# Patient Record
Sex: Male | Born: 1970 | Hispanic: Yes | Marital: Married | Smoking: Never smoker
Health system: Southern US, Community
[De-identification: ages and names within clinical notes are randomized; demographics above are authoritative.]

## PROBLEM LIST (undated history)

## (undated) DIAGNOSIS — E079 Disorder of thyroid, unspecified: Secondary | ICD-10-CM

## (undated) DIAGNOSIS — E039 Hypothyroidism, unspecified: Secondary | ICD-10-CM

## (undated) HISTORY — PX: INGUINAL HERNIA REPAIR: SUR1180

---

## 2016-12-17 ENCOUNTER — Other Ambulatory Visit: Payer: Self-pay | Admitting: Occupational Medicine

## 2016-12-17 ENCOUNTER — Ambulatory Visit: Payer: Self-pay

## 2016-12-17 DIAGNOSIS — M25562 Pain in left knee: Secondary | ICD-10-CM

## 2016-12-27 ENCOUNTER — Ambulatory Visit (INDEPENDENT_AMBULATORY_CARE_PROVIDER_SITE_OTHER): Payer: Worker's Compensation | Admitting: Orthopaedic Surgery

## 2016-12-27 ENCOUNTER — Encounter (INDEPENDENT_AMBULATORY_CARE_PROVIDER_SITE_OTHER): Payer: Self-pay | Admitting: Orthopaedic Surgery

## 2016-12-27 DIAGNOSIS — M25562 Pain in left knee: Secondary | ICD-10-CM | POA: Diagnosis not present

## 2016-12-27 NOTE — Progress Notes (Signed)
   Office Visit Note   Patient: David Palmer           Date of Birth: 08/06/1971           MRN: [id number] Visit Date: 12/27/2016              Requested by: No referring provider defined for this encounter. PCP: No primary care provider on file.   Assessment & Plan: Visit Diagnoses: No diagnosis found.  Plan: I will like to put him on light duty for 3 weeks. Continue warm compresses to help the hematoma. Follow-up in 3 weeks for recheck. The language barrier did increase the complexity of the visit. Total face to face encounter time was greater than 45 minutes and over half of this time was spent in counseling and/or coordination of care.  Follow-Up Instructions: Return in about 3 weeks (around 01/17/2017).   Orders:  No orders of the defined types were placed in this encounter.  No orders of the defined types were placed in this encounter.     Procedures: No procedures performed   Clinical Data: No additional findings.   Subjective: No chief complaint on file.   Patient is a Hispanic 46 year old gentleman who stepped into a deep hole about 2 weeks ago while at work. He states that he has swelling on the medial aspect of the knee and lateral aspect of the thigh. The pain is worse with bending of the knee and with weightbearing and feels cold at times. He denies any drainage. He does have some swelling on the lateral aspect of the thigh.    Review of Systems Complete review of systems negative except for history of present illness  Objective: Vital Signs: There were no vitals taken for this visit.  Physical Exam  Constitutional: He is oriented to person, place, and time. He appears well-developed and well-nourished.  HENT:  Head: Normocephalic and atraumatic.  Eyes: Pupils are equal, round, and reactive to light.  Neck: Neck supple.  Pulmonary/Chest: Effort normal.  Abdominal: Soft.  Musculoskeletal: Normal range of motion.  Neurological: He is alert and  oriented to person, place, and time.  Skin: Skin is warm.  Psychiatric: He has a normal mood and affect. His behavior is normal. Judgment and thought content normal.  Nursing note and vitals reviewed.   Ortho Exam Exam of the left knee and thigh shows focal swelling of the proximal medial tibia region. There is no signs of infection or drainage. There is tenderness palpation directly over the swelling. The hamstring tendons are intact. Exam of the left thigh shows a focal area consistent with a hematoma. There is no drainage. The skin is intact. No signs of infection. Specialty Comments:  No specialty comments available.  Imaging: No results found.   PMFS History: There are no active problems to display for this patient.  No past medical history on file.  No family history on file.  No past surgical history on file. Social History   Occupational History  . Not on file.   Social History Main Topics  . Smoking status: Not on file  . Smokeless tobacco: Not on file  . Alcohol use Not on file  . Drug use: Unknown  . Sexual activity: Not on file

## 2016-12-31 ENCOUNTER — Emergency Department (HOSPITAL_COMMUNITY): Payer: Worker's Compensation

## 2016-12-31 ENCOUNTER — Inpatient Hospital Stay (HOSPITAL_COMMUNITY)
Admission: EM | Admit: 2016-12-31 | Discharge: 2017-01-05 | DRG: 581 | Disposition: A | Discharge status: Home / Self Care | Payer: Worker's Compensation | Attending: Internal Medicine | Admitting: Internal Medicine

## 2016-12-31 ENCOUNTER — Telehealth (INDEPENDENT_AMBULATORY_CARE_PROVIDER_SITE_OTHER): Payer: Self-pay | Admitting: *Deleted

## 2016-12-31 ENCOUNTER — Encounter (HOSPITAL_COMMUNITY): Payer: Self-pay | Admitting: Emergency Medicine

## 2016-12-31 DIAGNOSIS — L02416 Cutaneous abscess of left lower limb: Secondary | ICD-10-CM | POA: Diagnosis present

## 2016-12-31 DIAGNOSIS — Z833 Family history of diabetes mellitus: Secondary | ICD-10-CM | POA: Diagnosis not present

## 2016-12-31 DIAGNOSIS — Z881 Allergy status to other antibiotic agents status: Secondary | ICD-10-CM

## 2016-12-31 DIAGNOSIS — E039 Hypothyroidism, unspecified: Secondary | ICD-10-CM | POA: Diagnosis present

## 2016-12-31 DIAGNOSIS — L03116 Cellulitis of left lower limb: Secondary | ICD-10-CM | POA: Diagnosis present

## 2016-12-31 DIAGNOSIS — L0291 Cutaneous abscess, unspecified: Secondary | ICD-10-CM

## 2016-12-31 DIAGNOSIS — S7012XA Contusion of left thigh, initial encounter: Secondary | ICD-10-CM | POA: Diagnosis present

## 2016-12-31 DIAGNOSIS — W19XXXA Unspecified fall, initial encounter: Secondary | ICD-10-CM | POA: Diagnosis present

## 2016-12-31 DIAGNOSIS — M79652 Pain in left thigh: Secondary | ICD-10-CM | POA: Diagnosis present

## 2016-12-31 DIAGNOSIS — L0390 Cellulitis, unspecified: Secondary | ICD-10-CM | POA: Diagnosis present

## 2016-12-31 HISTORY — DX: Hypothyroidism, unspecified: E03.9

## 2016-12-31 HISTORY — DX: Disorder of thyroid, unspecified: E07.9

## 2016-12-31 LAB — COMPREHENSIVE METABOLIC PANEL
ALT: 26 U/L (ref 17–63)
AST: 26 U/L (ref 15–41)
Albumin: 4 g/dL (ref 3.5–5.0)
Alkaline Phosphatase: 51 U/L (ref 38–126)
Anion gap: 9 (ref 5–15)
BILIRUBIN TOTAL: 1.3 mg/dL — AB (ref 0.3–1.2)
BUN: 9 mg/dL (ref 6–20)
CALCIUM: 9 mg/dL (ref 8.9–10.3)
CHLORIDE: 103 mmol/L (ref 101–111)
CO2: 25 mmol/L (ref 22–32)
CREATININE: 1.03 mg/dL (ref 0.61–1.24)
GFR calc Af Amer: >60 mL/min (ref >60)
Glucose, Bld: 112 mg/dL — ABNORMAL HIGH (ref 65–99)
Potassium: 3.7 mmol/L (ref 3.5–5.1)
Sodium: 137 mmol/L (ref 135–145)
TOTAL PROTEIN: 7.2 g/dL (ref 6.5–8.1)

## 2016-12-31 LAB — I-STAT CG4 LACTIC ACID, ED
Lactic Acid, Venous: 0.94 mmol/L (ref 0.5–1.9)
Lactic Acid, Venous: 1.75 mmol/L (ref 0.5–1.9)

## 2016-12-31 LAB — CBC WITH DIFFERENTIAL/PLATELET
BASOS PCT: 0 %
Basophils Absolute: 0 10*3/uL (ref 0.0–0.1)
EOS ABS: 0 10*3/uL (ref 0.0–0.7)
EOS PCT: 0 %
HCT: 38.2 % — ABNORMAL LOW (ref 39.0–52.0)
Hemoglobin: 13 g/dL (ref 13.0–17.0)
LYMPHS ABS: 0.9 10*3/uL (ref 0.7–4.0)
Lymphocytes Relative: 6 %
MCH: 29.1 pg (ref 26.0–34.0)
MCHC: 34 g/dL (ref 30.0–36.0)
MCV: 85.7 fL (ref 78.0–100.0)
Monocytes Absolute: 0.5 10*3/uL (ref 0.1–1.0)
Monocytes Relative: 3 %
Neutro Abs: 13.8 10*3/uL — ABNORMAL HIGH (ref 1.7–7.7)
Neutrophils Relative %: 91 %
PLATELETS: 234 10*3/uL (ref 150–400)
RBC: 4.46 MIL/uL (ref 4.22–5.81)
RDW: 14.2 % (ref 11.5–15.5)
WBC: 15.2 10*3/uL — AB (ref 4.0–10.5)

## 2016-12-31 MED ORDER — ACETAMINOPHEN 325 MG PO TABS
650.0000 mg | ORAL_TABLET | Freq: Once | ORAL | Status: AC
  Administered 2016-12-31: 650 mg via ORAL

## 2016-12-31 MED ORDER — LEVOTHYROXINE SODIUM 100 MCG PO TABS
125.0000 ug | ORAL_TABLET | Freq: Every day | ORAL | Status: DC
  Administered 2017-01-01 – 2017-01-05 (×5): 125 ug via ORAL
  Filled 2016-12-31 (×6): qty 1

## 2016-12-31 MED ORDER — SODIUM CHLORIDE 0.9 % IV SOLN
INTRAVENOUS | Status: AC
  Administered 2016-12-31 – 2017-01-01 (×3): via INTRAVENOUS

## 2016-12-31 MED ORDER — ONDANSETRON HCL 4 MG PO TABS: 4.0000 mg | ORAL_TABLET | Freq: Four times a day (QID) | ORAL | Status: DC | PRN

## 2016-12-31 MED ORDER — DIPHENHYDRAMINE HCL 50 MG/ML IJ SOLN
25.0000 mg | Freq: Once | INTRAMUSCULAR | Status: AC
  Administered 2016-12-31: 25 mg via INTRAVENOUS

## 2016-12-31 MED ORDER — TRAMADOL HCL 50 MG PO TABS
50.0000 mg | ORAL_TABLET | Freq: Four times a day (QID) | ORAL | Status: DC | PRN
  Administered 2017-01-01 – 2017-01-02 (×5): 50 mg via ORAL
  Filled 2016-12-31 (×5): qty 1

## 2016-12-31 MED ORDER — PIPERACILLIN-TAZOBACTAM 3.375 G IVPB
3.3750 g | Freq: Three times a day (TID) | INTRAVENOUS | Status: DC
  Administered 2017-01-01: 3.375 g via INTRAVENOUS
  Filled 2016-12-31 (×2): qty 50

## 2016-12-31 MED ORDER — SODIUM CHLORIDE 0.9 % IV BOLUS (SEPSIS)
1000.0000 mL | Freq: Once | INTRAVENOUS | Status: AC
  Administered 2016-12-31: 1000 mL via INTRAVENOUS

## 2016-12-31 MED ORDER — ACETAMINOPHEN 650 MG RE SUPP: 650.0000 mg | Freq: Four times a day (QID) | RECTAL | Status: DC | PRN

## 2016-12-31 MED ORDER — VANCOMYCIN HCL IN DEXTROSE 1-5 GM/200ML-% IV SOLN
1000.0000 mg | Freq: Once | INTRAVENOUS | Status: AC
  Administered 2016-12-31: 1000 mg via INTRAVENOUS
  Filled 2016-12-31: qty 200

## 2016-12-31 MED ORDER — PIPERACILLIN-TAZOBACTAM 3.375 G IVPB 30 MIN
3.3750 g | Freq: Once | INTRAVENOUS | Status: AC
  Administered 2016-12-31: 3.375 g via INTRAVENOUS
  Filled 2016-12-31: qty 50

## 2016-12-31 MED ORDER — ACETAMINOPHEN 325 MG PO TABS
ORAL_TABLET | ORAL | Status: AC
Start: 2016-12-31 — End: 2017-01-01
  Filled 2016-12-31: qty 2

## 2016-12-31 MED ORDER — DIPHENHYDRAMINE HCL 50 MG/ML IJ SOLN
25.0000 mg | Freq: Once | INTRAMUSCULAR | Status: DC
  Filled 2016-12-31: qty 1

## 2016-12-31 MED ORDER — ACETAMINOPHEN 325 MG PO TABS
650.0000 mg | ORAL_TABLET | Freq: Four times a day (QID) | ORAL | Status: DC | PRN
  Filled 2016-12-31: qty 2

## 2016-12-31 MED ORDER — ACETAMINOPHEN 325 MG PO TABS
650.0000 mg | ORAL_TABLET | Freq: Once | ORAL | Status: AC
  Administered 2016-12-31: 650 mg via ORAL
  Filled 2016-12-31: qty 2

## 2016-12-31 MED ORDER — DIPHENHYDRAMINE HCL 50 MG/ML IJ SOLN
25.0000 mg | Freq: Two times a day (BID) | INTRAMUSCULAR | Status: DC
  Administered 2017-01-01 – 2017-01-05 (×9): 25 mg via INTRAVENOUS
  Filled 2016-12-31 (×9): qty 1

## 2016-12-31 MED ORDER — ONDANSETRON HCL 4 MG/2ML IJ SOLN: 4.0000 mg | Freq: Four times a day (QID) | INTRAMUSCULAR | Status: DC | PRN

## 2016-12-31 MED ORDER — VANCOMYCIN HCL IN DEXTROSE 1-5 GM/200ML-% IV SOLN
1000.0000 mg | Freq: Two times a day (BID) | INTRAVENOUS | Status: DC
  Administered 2017-01-01 – 2017-01-05 (×9): 1000 mg via INTRAVENOUS
  Filled 2016-12-31 (×11): qty 200

## 2016-12-31 NOTE — Progress Notes (Signed)
Pharmacy Antibiotic Note  David Palmer is a 46 y.o. male admitted on 12/31/2016 with L-thigh cellulitis. Pharmacy has been consulted for Vancomycin and Zosyn dosing. The patient is noted to have "redman reaction" to Vancomycin so will pre-medicate with benadryl and infuse more slowly to prevent this from occurring.   Vancomycin 1g IV x 1 was already given in the [facility] at 2000 today. SCr 1.03, CrCl~90 ml/min.   Plan: 1. Start Vancomycin 1g IV every 12 hours 2. Will pre-medicate with benadryl 25 mg IV 30 minutes prior to each Vancomycin dose 3. Start Zosyn 3.375g IV every 8 hours 4. Will continue to follow renal function, culture results, LOT, and antibiotic de-escalation plans   Height: 5\' 4"  (162.6 cm) Weight: 161 lb 3.2 oz (73.1 kg) IBW/kg (Calculated) : 59.2  Temp (24hrs), Avg:99.8 F (37.7 C), Min:98.3 F (36.8 C), Max:102.3 F (39.1 C)   Recent Labs Lab 12/31/16 1719 12/31/16 1741 12/31/16 2044  WBC 15.2*  --   --   CREATININE 1.03  --   --   LATICACIDVEN  --  0.94 1.75    Estimated Creatinine Clearance: 80.4 mL/min (by C-G formula based on SCr of 1.03 mg/dL).    Allergies  Allergen Reactions  . Vancomycin Other (See Comments)    Redman infusion    Antimicrobials this admission: Vanc 2/12 >> Zosyn 2/12 >>  Dose adjustments this admission:   Microbiology results:  Thank you for allowing pharmacy to be a part of this patient's care.  Georgina PillionElizabeth Cuca Benassi, PharmD, BCPS Clinical Pharmacist Pager: [phone] 12/31/2016 10:40 PM

## 2016-12-31 NOTE — Telephone Encounter (Signed)
[facility] called this afternoon in regards to this patient. He stated the patient has cellulitis in his leg and wanted to make us aware of this. The CB # [phone]. Thank you

## 2016-12-31 NOTE — ED Notes (Signed)
Patient transported to X-ray 

## 2016-12-31 NOTE — H&P (Addendum)
History and Physical    David Palmer ZOX:[id number] DOB: Aug 09, 1971 DOA: 12/31/2016  PCP: Pcp Not In System  Patient coming from: Home.  Chief Complaint: Left thigh pain swelling and fever.  Patient's daughter provided Spanish translation.  HPI: David Palmer is a 46 y.o. male with history of hypothyroidism presents to the ER with complaints of worsening pain and swelling in the left thigh area. 2 weeks ago patient had a fall and had swelling in the left knee area and had followed up with orthopedic surgeon. X-rays did not show anything acute. Last 2 days patient has been having increasing swelling and fever and chills and erythema over the left thigh area. X-rays in the ER did not show anything acute. ER physician had incised and drained at least 100 mL of pus. Patient is being admitted for IV antibiotics given the size of the infected area.   ED Course: X-ray of the left femur does not show anything acute. Patient had incision and drainage in the ER.  Review of Systems: As per HPI, rest all negative.   Past Medical History:  Diagnosis Date  . Thyroid disease     History reviewed. No pertinent surgical history.   reports that he has never smoked. He has never used smokeless tobacco. He reports that he does not drink alcohol or use drugs.  Allergies  Allergen Reactions  . Vancomycin Other (See Comments)    Redman infusion    Family History  Problem Relation Age of Onset  . Diabetes Mellitus II Father     Prior to Admission medications   Medication Sig Start Date End Date Taking? Authorizing Provider  ibuprofen (ADVIL,MOTRIN) 800 MG tablet Take 800 mg by mouth 3 (three) times daily as needed (for pain).   Yes Historical Provider, MD  levothyroxine (SYNTHROID, LEVOTHROID) 125 MCG tablet Take 125 mcg by mouth daily before breakfast.   Yes Historical Provider, MD  traMADol (ULTRAM) 50 MG tablet Take by mouth every 4 (four) hours as needed (for pain).   Yes Historical  Provider, MD    Physical Exam: Vitals:   12/31/16 2030 12/31/16 2033 12/31/16 2045 12/31/16 2100  BP: 140/69  131/78 117/70  Pulse: (!) 127  114 112  Resp: 12  14 20   Temp:  98.3 F (36.8 C)    TempSrc:  Oral    SpO2: 100%  100% 100%      Constitutional: Moderately built and nourished. Vitals:   12/31/16 2030 12/31/16 2033 12/31/16 2045 12/31/16 2100  BP: 140/69  131/78 117/70  Pulse: (!) 127  114 112  Resp: 12  14 20   Temp:  98.3 F (36.8 C)    TempSrc:  Oral    SpO2: 100%  100% 100%   Eyes: Anicteric no pallor. ENMT: No discharge from the ears eyes nose and mouth. Neck: No mass felt. No neck rigidity. Respiratory: No rhonchi or crepitations. Cardiovascular: S1-S2 heard mildly tachycardic. Abdomen: Soft nontender bowel sounds present. Musculoskeletal: Left thigh area is erythematous extending from the knee to the lateral aspect of the hip. Patient has some difficulty in flexing the hip and left knee. Skin: Erythema extending from the knee to the lateral aspect of the left hip. Neurologic: Alert awake oriented to time place and person. Moves all extremities. Psychiatric: Appears normal. Normal affect.   Labs on Admission: I have personally reviewed following labs and imaging studies  CBC:  Recent Labs Lab 12/31/16 1719  WBC 15.2*  NEUTROABS 13.8*  HGB 13.0  HCT 38.2*  MCV 85.7  PLT 234   Basic Metabolic Panel:  Recent Labs Lab 12/31/16 1719  NA 137  K 3.7  CL 103  CO2 25  GLUCOSE 112*  BUN 9  CREATININE 1.03  CALCIUM 9.0   GFR: CrCl cannot be calculated (Unknown ideal weight.). Liver Function Tests:  Recent Labs Lab 12/31/16 1719  AST 26  ALT 26  ALKPHOS 51  BILITOT 1.3*  PROT 7.2  ALBUMIN 4.0   No results for input(s): LIPASE, AMYLASE in the last 168 hours. No results for input(s): AMMONIA in the last 168 hours. Coagulation Profile: No results for input(s): INR, PROTIME in the last 168 hours. Cardiac Enzymes: No results for  input(s): CKTOTAL, CKMB, CKMBINDEX, TROPONINI in the last 168 hours. BNP (last 3 results) No results for input(s): PROBNP in the last 8760 hours. HbA1C: No results for input(s): HGBA1C in the last 72 hours. CBG: No results for input(s): GLUCAP in the last 168 hours. Lipid Profile: No results for input(s): CHOL, HDL, LDLCALC, TRIG, CHOLHDL, LDLDIRECT in the last 72 hours. Thyroid Function Tests: No results for input(s): TSH, T4TOTAL, FREET4, T3FREE, THYROIDAB in the last 72 hours. Anemia Panel: No results for input(s): VITAMINB12, FOLATE, FERRITIN, TIBC, IRON, RETICCTPCT in the last 72 hours. Urine analysis: No results found for: COLORURINE, APPEARANCEUR, LABSPEC, PHURINE, GLUCOSEU, HGBUR, BILIRUBINUR, KETONESUR, PROTEINUR, UROBILINOGEN, NITRITE, LEUKOCYTESUR Sepsis Labs: @LABRCNTIP (procalcitonin:4,lacticidven:4) )No results found for this or any previous visit (from the past 240 hour(s)).   Radiological Exams on Admission: Dg Femur Min 2 Views Left  Result Date: 12/31/2016 CLINICAL DATA:  stepped into a deep hole on 1/26 while at work. He states that he has swelling on the medial aspect of the knee and lateral aspect of the thigh. The pain is worse with bending of the knee and with weightbearing and feels cold at times. EXAM: LEFT FEMUR 2 VIEWS COMPARISON:  None. FINDINGS: There is no acute fracture or subluxation. Soft tissue edema is noted throughout the thigh, focally prominent in the lateral aspect of the midthigh. No radiopaque foreign body identified however. IMPRESSION: 1.  No evidence for acute fracture. 2. Soft tissue swelling. Electronically Signed   By: Norva PavlovElizabeth  Brown M.D.   On: 12/31/2016 19:53     Assessment/Plan Principal Problem:   Cellulitis of left thigh Active Problems:   Cellulitis   Hypothyroidism    1. Cellulitis and abscess of the left thigh with possible developing sepsis - I have ordered MRI of the left thigh area to further study if there is any deep  abscess. I have placed patient on vancomycin and Zosyn. Follow cultures, continue hydration. Based on the MRI will have further plan. Closely observe for any development of compartment syndrome. Continue with hydration. 2. Hypothyroidism on Synthroid.  While in the ER patient did develop a rash on the upper part of the body with itching when vancomycin was given. Has not had any tongue swelling or difficulty breathing. Patient's symptoms are typical of red man reaction. I have asked pharmacy to infuse vancomycin slowly and give Benadryl 25 mg prior to giving vancomycin.   DVT prophylaxis: SCDs. Code Status: Full code.  Family Communication: Patient's daughter.  Disposition Plan: Home.  Consults called: None.  Admission status: Inpatient.    Eduard ClosKAKRAKANDY,Honesti Seaberg N. MD [facility] Pager [phone].  If 7PM-7AM, please contact night-coverage [email or web address] Password [email or web address]  12/31/2016, 10:19 PM

## 2016-12-31 NOTE — ED Triage Notes (Signed)
Pt sent here for eval of pain in upper right leg x 2 days

## 2017-01-01 ENCOUNTER — Encounter (HOSPITAL_COMMUNITY): Payer: Self-pay | Admitting: Certified Registered Nurse Anesthetist

## 2017-01-01 ENCOUNTER — Inpatient Hospital Stay (HOSPITAL_COMMUNITY): Payer: Worker's Compensation

## 2017-01-01 ENCOUNTER — Inpatient Hospital Stay (HOSPITAL_COMMUNITY): Payer: Worker's Compensation | Admitting: Certified Registered Nurse Anesthetist

## 2017-01-01 ENCOUNTER — Encounter (HOSPITAL_COMMUNITY)
Admission: EM | Disposition: A | Discharge status: Home / Self Care | Payer: Self-pay | Source: Home / Self Care | Attending: Internal Medicine

## 2017-01-01 ENCOUNTER — Ambulatory Visit (INDEPENDENT_AMBULATORY_CARE_PROVIDER_SITE_OTHER): Payer: Self-pay | Admitting: Orthopaedic Surgery

## 2017-01-01 ENCOUNTER — Ambulatory Visit (INDEPENDENT_AMBULATORY_CARE_PROVIDER_SITE_OTHER): Payer: Worker's Compensation | Admitting: Orthopaedic Surgery

## 2017-01-01 DIAGNOSIS — E039 Hypothyroidism, unspecified: Secondary | ICD-10-CM

## 2017-01-01 DIAGNOSIS — L03116 Cellulitis of left lower limb: Secondary | ICD-10-CM

## 2017-01-01 HISTORY — PX: INCISION AND DRAINAGE ABSCESS: SHX5864

## 2017-01-01 LAB — SURGICAL PCR SCREEN
MRSA, PCR: NEGATIVE
STAPHYLOCOCCUS AUREUS: NEGATIVE

## 2017-01-01 LAB — CBC
HCT: 33 % — ABNORMAL LOW (ref 39.0–52.0)
Hemoglobin: 11.2 g/dL — ABNORMAL LOW (ref 13.0–17.0)
MCH: 29.1 pg (ref 26.0–34.0)
MCHC: 33.9 g/dL (ref 30.0–36.0)
MCV: 85.7 fL (ref 78.0–100.0)
Platelets: 208 10*3/uL (ref 150–400)
RBC: 3.85 MIL/uL — ABNORMAL LOW (ref 4.22–5.81)
RDW: 14.5 % (ref 11.5–15.5)
WBC: 13.1 10*3/uL — ABNORMAL HIGH (ref 4.0–10.5)

## 2017-01-01 LAB — BASIC METABOLIC PANEL WITH GFR
Anion gap: 7 (ref 5–15)
BUN: 7 mg/dL (ref 6–20)
CO2: 25 mmol/L (ref 22–32)
Calcium: 8 mg/dL — ABNORMAL LOW (ref 8.9–10.3)
Chloride: 103 mmol/L (ref 101–111)
Creatinine, Ser: 0.85 mg/dL (ref 0.61–1.24)
GFR calc Af Amer: >60 mL/min
GFR calc non Af Amer: >60 mL/min
Glucose, Bld: 128 mg/dL — ABNORMAL HIGH (ref 65–99)
Potassium: 3.2 mmol/L — ABNORMAL LOW (ref 3.5–5.1)
Sodium: 135 mmol/L (ref 135–145)

## 2017-01-01 SURGERY — INCISION AND DRAINAGE, ABSCESS
Anesthesia: General | Site: Thigh | Laterality: Left

## 2017-01-01 MED ORDER — FENTANYL CITRATE (PF) 100 MCG/2ML IJ SOLN
25.0000 ug | INTRAMUSCULAR | Status: DC | PRN
  Administered 2017-01-01 (×2): 25 ug via INTRAVENOUS

## 2017-01-01 MED ORDER — SODIUM CHLORIDE 0.9 % IV SOLN
30.0000 meq | Freq: Once | INTRAVENOUS | Status: AC
  Administered 2017-01-01: 30 meq via INTRAVENOUS
  Filled 2017-01-01: qty 15

## 2017-01-01 MED ORDER — EPHEDRINE 5 MG/ML INJ
INTRAVENOUS | Status: AC
  Filled 2017-01-01: qty 10

## 2017-01-01 MED ORDER — FENTANYL CITRATE (PF) 100 MCG/2ML IJ SOLN
INTRAMUSCULAR | Status: AC
  Filled 2017-01-01: qty 2

## 2017-01-01 MED ORDER — PHENYLEPHRINE 40 MCG/ML (10ML) SYRINGE FOR IV PUSH (FOR BLOOD PRESSURE SUPPORT)
PREFILLED_SYRINGE | INTRAVENOUS | Status: AC
  Filled 2017-01-01: qty 10

## 2017-01-01 MED ORDER — OXYCODONE-ACETAMINOPHEN 5-325 MG PO TABS
2.0000 | ORAL_TABLET | Freq: Once | ORAL | Status: AC
  Administered 2017-01-01: 2 via ORAL
  Filled 2017-01-01: qty 2

## 2017-01-01 MED ORDER — 0.9 % SODIUM CHLORIDE (POUR BTL) OPTIME
TOPICAL | Status: DC | PRN
  Administered 2017-01-01: 1000 mL

## 2017-01-01 MED ORDER — BUPIVACAINE LIPOSOME 1.3 % IJ SUSP
20.0000 mL | Freq: Once | INTRAMUSCULAR | Status: AC
  Administered 2017-01-01: 20 mL
  Filled 2017-01-01: qty 20

## 2017-01-01 MED ORDER — ONDANSETRON HCL 4 MG/2ML IJ SOLN
INTRAMUSCULAR | Status: DC | PRN
  Administered 2017-01-01: 4 mg via INTRAVENOUS

## 2017-01-01 MED ORDER — SUCCINYLCHOLINE CHLORIDE 20 MG/ML IJ SOLN
INTRAMUSCULAR | Status: DC | PRN
  Administered 2017-01-01: 100 mg via INTRAVENOUS

## 2017-01-01 MED ORDER — MEPERIDINE HCL 25 MG/ML IJ SOLN: 6.2500 mg | INTRAMUSCULAR | Status: DC | PRN

## 2017-01-01 MED ORDER — PROPOFOL 10 MG/ML IV BOLUS
INTRAVENOUS | Status: AC
  Filled 2017-01-01: qty 20

## 2017-01-01 MED ORDER — ONDANSETRON HCL 4 MG/2ML IJ SOLN
INTRAMUSCULAR | Status: AC
  Filled 2017-01-01: qty 2

## 2017-01-01 MED ORDER — ALBUMIN HUMAN 5 % IV SOLN
INTRAVENOUS | Status: DC | PRN
  Administered 2017-01-01: 15:00:00 via INTRAVENOUS

## 2017-01-01 MED ORDER — DEXAMETHASONE SODIUM PHOSPHATE 10 MG/ML IJ SOLN
INTRAMUSCULAR | Status: DC | PRN
  Administered 2017-01-01: 10 mg via INTRAVENOUS

## 2017-01-01 MED ORDER — PIPERACILLIN-TAZOBACTAM 3.375 G IVPB 30 MIN
3.3750 g | Freq: Once | INTRAVENOUS | Status: AC
  Filled 2017-01-01: qty 50

## 2017-01-01 MED ORDER — FENTANYL CITRATE (PF) 100 MCG/2ML IJ SOLN
INTRAMUSCULAR | Status: DC | PRN
  Administered 2017-01-01: 50 ug via INTRAVENOUS
  Administered 2017-01-01: 100 ug via INTRAVENOUS

## 2017-01-01 MED ORDER — LACTATED RINGERS IV SOLN
INTRAVENOUS | Status: DC | PRN
  Administered 2017-01-01: 14:00:00 via INTRAVENOUS

## 2017-01-01 MED ORDER — METOCLOPRAMIDE HCL 5 MG/ML IJ SOLN: 10.0000 mg | Freq: Once | INTRAMUSCULAR | Status: DC | PRN

## 2017-01-01 MED ORDER — LACTATED RINGERS IV SOLN: INTRAVENOUS | Status: DC

## 2017-01-01 MED ORDER — LIDOCAINE HCL (CARDIAC) 20 MG/ML IV SOLN
INTRAVENOUS | Status: DC | PRN
  Administered 2017-01-01: 100 mg via INTRAVENOUS

## 2017-01-01 MED ORDER — PIPERACILLIN-TAZOBACTAM 3.375 G IVPB
3.3750 g | Freq: Once | INTRAVENOUS | Status: DC
  Administered 2017-01-01: 3.375 g via INTRAVENOUS
  Filled 2017-01-01: qty 50

## 2017-01-01 MED ORDER — MIDAZOLAM HCL 5 MG/5ML IJ SOLN
INTRAMUSCULAR | Status: DC | PRN
  Administered 2017-01-01: 2 mg via INTRAVENOUS

## 2017-01-01 MED ORDER — PIPERACILLIN-TAZOBACTAM 3.375 G IVPB 30 MIN
3.3750 g | Freq: Once | INTRAVENOUS | Status: DC
  Filled 2017-01-01: qty 50

## 2017-01-01 MED ORDER — PROPOFOL 10 MG/ML IV BOLUS
INTRAVENOUS | Status: DC | PRN
  Administered 2017-01-01: 130 mg via INTRAVENOUS

## 2017-01-01 MED ORDER — MIDAZOLAM HCL 2 MG/2ML IJ SOLN
INTRAMUSCULAR | Status: AC
  Filled 2017-01-01: qty 2

## 2017-01-01 MED ORDER — DEXAMETHASONE SODIUM PHOSPHATE 10 MG/ML IJ SOLN
INTRAMUSCULAR | Status: AC
  Filled 2017-01-01: qty 1

## 2017-01-01 MED ORDER — HYDROMORPHONE HCL 2 MG/ML IJ SOLN
0.5000 mg | INTRAMUSCULAR | Status: DC | PRN
  Administered 2017-01-01 – 2017-01-03 (×4): 0.5 mg via INTRAVENOUS
  Filled 2017-01-01 (×5): qty 1

## 2017-01-01 SURGICAL SUPPLY — 30 items
BLADE SURG ROTATE 9660 (MISCELLANEOUS) IMPLANT
BNDG GAUZE ELAST 4 BULKY (GAUZE/BANDAGES/DRESSINGS) ×3 IMPLANT
CANISTER SUCTION 2500CC (MISCELLANEOUS) ×3 IMPLANT
COVER SURGICAL LIGHT HANDLE (MISCELLANEOUS) ×3 IMPLANT
DRAPE LAPAROSCOPIC ABDOMINAL (DRAPES) IMPLANT
DRAPE LAPAROTOMY 100X72 PEDS (DRAPES) IMPLANT
DRSG PAD ABDOMINAL 8X10 ST (GAUZE/BANDAGES/DRESSINGS) ×3 IMPLANT
ELECT CAUTERY BLADE 6.4 (BLADE) ×3 IMPLANT
ELECT REM PT RETURN 9FT ADLT (ELECTROSURGICAL) ×3
ELECTRODE REM PT RTRN 9FT ADLT (ELECTROSURGICAL) ×1 IMPLANT
GAUZE SPONGE 4X4 12PLY STRL (GAUZE/BANDAGES/DRESSINGS) IMPLANT
GLOVE BIO SURGEON STRL SZ 6 (GLOVE) ×3 IMPLANT
GLOVE BIOGEL PI IND STRL 6.5 (GLOVE) ×1 IMPLANT
GLOVE BIOGEL PI INDICATOR 6.5 (GLOVE) ×2
GOWN STRL REUS W/ TWL LRG LVL3 (GOWN DISPOSABLE) ×2 IMPLANT
GOWN STRL REUS W/TWL LRG LVL3 (GOWN DISPOSABLE) ×4
KIT BASIN OR (CUSTOM PROCEDURE TRAY) ×3 IMPLANT
KIT ROOM TURNOVER OR (KITS) ×3 IMPLANT
NS IRRIG 1000ML POUR BTL (IV SOLUTION) ×3 IMPLANT
PACK SURGICAL SETUP 50X90 (CUSTOM PROCEDURE TRAY) ×3 IMPLANT
PAD ARMBOARD 7.5X6 YLW CONV (MISCELLANEOUS) ×3 IMPLANT
PENCIL BUTTON HOLSTER BLD 10FT (ELECTRODE) ×3 IMPLANT
SWAB COLLECTION DEVICE MRSA (MISCELLANEOUS) IMPLANT
TAPE CLOTH SURG 4X10 WHT LF (GAUZE/BANDAGES/DRESSINGS) ×3 IMPLANT
TOWEL OR 17X24 6PK STRL BLUE (TOWEL DISPOSABLE) ×3 IMPLANT
TOWEL OR 17X26 10 PK STRL BLUE (TOWEL DISPOSABLE) ×3 IMPLANT
TUBE ANAEROBIC SPECIMEN COL (MISCELLANEOUS) IMPLANT
TUBE CONNECTING 12'X1/4 (SUCTIONS) ×1
TUBE CONNECTING 12X1/4 (SUCTIONS) ×2 IMPLANT
YANKAUER SUCT BULB TIP NO VENT (SUCTIONS) ×3 IMPLANT

## 2017-01-01 NOTE — Care Management Note (Addendum)
Case Management Note  Patient Details  Name: David Palmer MRN: [id number] Date of Birth: 03/27/1971  Subjective/Objective:                    Action/Plan:  Repeat I and D today I spoke with the patient and wife   at bedside for worker's comp information.   [employer]  Claim [id number]  Adjustor Luvenia HellerLindsey Berner phone [phone] , called and left message. Expected Discharge Date:                  Expected Discharge Plan:  Home/Self Care  In-House Referral:     Discharge planning Services  CM Consult  Post Acute Care Choice:    Choice offered to:  Patient, Spouse  DME Arranged:    DME Agency:     HH Arranged:    HH Agency:     Status of Service:  In process, will continue to follow  If discussed at Long Length of Stay Meetings, dates discussed:    Additional Comments:  Kingsley PlanWile, Stefana Lodico Marie, RN 01/01/2017, 10:02 AM

## 2017-01-01 NOTE — Telephone Encounter (Signed)
See message below °

## 2017-01-01 NOTE — Progress Notes (Signed)
Placed on tele to monitor  

## 2017-01-01 NOTE — Telephone Encounter (Signed)
He should go to ER then.

## 2017-01-01 NOTE — Progress Notes (Signed)
[facility]. Interpreter called to assist with explaining surgery consent.

## 2017-01-01 NOTE — ED Provider Notes (Signed)
[facility]-EMERGENCY DEPT Provider Note   CSN: [id number] Arrival date & time: 12/31/16  1621     History   Chief Complaint Chief Complaint  Patient presents with  . Leg Pain    HPI David Palmer is a 46 y.o. male.  Patient had a fall at work but with a mechanical fall on 12/14/2016. He was seen by PCP and had an x-ray done of his knee which was normal. He had some scrapes on his leg that was getting better until 2 days ago when he started noticing swelling, redness and warmth to the leg. It significantly worsened in the last 2 days. He also had fever up to 103.   The history is provided by the patient and a relative. The history is limited by a language barrier. A language interpreter was used.  Leg Pain   This is a new problem. The current episode started 2 days ago. The problem occurs constantly. The problem has been rapidly worsening. The pain is present in the left upper leg. The quality of the pain is described as sharp and constant. The pain is at a severity of 7/10. The pain is severe. Associated symptoms comments: Red, swollen, painful.  Fever.  No nausea/vomiting or diarrhea.  No dizziness or passing out.. There has been a history of trauma.    Past Medical History:  Diagnosis Date  . Thyroid disease     Patient Active Problem List   Diagnosis Date Noted  . Cellulitis 12/31/2016  . Cellulitis of left thigh 12/31/2016  . Hypothyroidism 12/31/2016  . Acute pain of left knee 12/27/2016    History reviewed. No pertinent surgical history.     Home Medications    Prior to Admission medications   Medication Sig Start Date End Date Taking? Authorizing Provider  ibuprofen (ADVIL,MOTRIN) 800 MG tablet Take 800 mg by mouth 3 (three) times daily as needed (for pain).   Yes Historical Provider, MD  levothyroxine (SYNTHROID, LEVOTHROID) 125 MCG tablet Take 125 mcg by mouth daily before breakfast.   Yes Historical Provider, MD  traMADol (ULTRAM) 50 MG tablet Take by mouth  every 4 (four) hours as needed (for pain).   Yes Historical Provider, MD    Family History Family History  Problem Relation Age of Onset  . Diabetes Mellitus II Father     Social History Social History  Substance Use Topics  . Smoking status: Never Smoker  . Smokeless tobacco: Never Used  . Alcohol use No     Allergies   Vancomycin   Review of Systems Review of Systems  All other systems reviewed and are negative.    Physical Exam Updated Vital Signs BP (!) 115/55 (BP Location: Left Arm)   Pulse 96   Temp 98.8 F (37.1 C) (Oral)   Resp 19   Ht 5\' 4"  (1.626 m)   Wt 161 lb 3.2 oz (73.1 kg)   SpO2 100%   BMI 27.67 kg/m   Physical Exam  Constitutional: He is oriented to person, place, and time. He appears well-developed and well-nourished. No distress.  HENT:  Head: Normocephalic and atraumatic.  Mouth/Throat: Oropharynx is clear and moist.  Birthmark present over the left side of pt's face  Eyes: Conjunctivae and EOM are normal. Pupils are equal, round, and reactive to light.  Neck: Normal range of motion. Neck supple.  Cardiovascular: Regular rhythm and intact distal pulses.  Tachycardia present.   No murmur heard. Pulmonary/Chest: Effort normal and breath sounds normal. No respiratory  distress. He has no wheezes. He has no rales.  Abdominal: Soft. He exhibits no distension. There is no tenderness. There is no rebound and no guarding.  Musculoskeletal: Normal range of motion. He exhibits tenderness. He exhibits no edema.       Left upper leg: He exhibits tenderness and swelling.       Legs: Neurological: He is alert and oriented to person, place, and time.  Skin: Skin is warm and dry. No rash noted. No erythema.  Psychiatric: He has a normal mood and affect. His behavior is normal.  Nursing note and vitals reviewed.    ED Treatments / Results  Labs (all labs ordered are listed, but only abnormal results are displayed) Labs Reviewed  COMPREHENSIVE  METABOLIC PANEL - Abnormal; Notable for the following:       Result Value   Glucose, Bld 112 (*)    Total Bilirubin 1.3 (*)    All other components within normal limits  CBC WITH DIFFERENTIAL/PLATELET - Abnormal; Notable for the following:    WBC 15.2 (*)    HCT 38.2 (*)    Neutro Abs 13.8 (*)    All other components within normal limits  BASIC METABOLIC PANEL  CBC  I-STAT CG4 LACTIC ACID, ED  I-STAT CG4 LACTIC ACID, ED    EKG  EKG Interpretation  Date/Time:  Monday December 31 2016 20:27:59 EST Ventricular Rate:  136 PR Interval:    QRS Duration: 93 QT Interval:  250 QTC Calculation: 376 R Axis:   68 Text Interpretation:  Sinus tachycardia Borderline repolarization abnormality No previous tracing Confirmed by Anitra Lauth  MD, Alphonzo Lemmings ([id number]) on 12/31/2016 9:40:59 PM       Radiology Dg Femur Min 2 Views Left  Result Date: 12/31/2016 CLINICAL DATA:  stepped into a deep hole on 1/26 while at work. He states that he has swelling on the medial aspect of the knee and lateral aspect of the thigh. The pain is worse with bending of the knee and with weightbearing and feels cold at times. EXAM: LEFT FEMUR 2 VIEWS COMPARISON:  None. FINDINGS: There is no acute fracture or subluxation. Soft tissue edema is noted throughout the thigh, focally prominent in the lateral aspect of the midthigh. No radiopaque foreign body identified however. IMPRESSION: 1.  No evidence for acute fracture. 2. Soft tissue swelling. Electronically Signed   By: Norva Pavlov M.D.   On: 12/31/2016 19:53    Procedures Procedures (including critical care time)  Medications Ordered in ED Medications  acetaminophen (TYLENOL) 325 MG tablet (0 mg  Hold 12/31/16 2118)  levothyroxine (SYNTHROID, LEVOTHROID) tablet 125 mcg (not administered)  traMADol (ULTRAM) tablet 50 mg (not administered)  acetaminophen (TYLENOL) tablet 650 mg (not administered)    Or  acetaminophen (TYLENOL) suppository 650 mg (not administered)    ondansetron (ZOFRAN) tablet 4 mg (not administered)    Or  ondansetron (ZOFRAN) injection 4 mg (not administered)  0.9 %  sodium chloride infusion ( Intravenous New Bag/Given 12/31/16 2229)  diphenhydrAMINE (BENADRYL) injection 25 mg (not administered)    And  vancomycin (VANCOCIN) IVPB 1000 mg/200 mL premix (not administered)  piperacillin-tazobactam (ZOSYN) IVPB 3.375 g (not administered)  acetaminophen (TYLENOL) tablet 650 mg (650 mg Oral Given 12/31/16 1720)  acetaminophen (TYLENOL) tablet 650 mg (650 mg Oral Given 12/31/16 2005)  vancomycin (VANCOCIN) IVPB 1000 mg/200 mL premix (0 mg Intravenous Stopped 12/31/16 2033)  diphenhydrAMINE (BENADRYL) injection 25 mg (25 mg Intravenous Given 12/31/16 2033)  sodium chloride 0.9 % bolus  1,000 mL (0 mLs Intravenous Stopped 12/31/16 2204)  piperacillin-tazobactam (ZOSYN) IVPB 3.375 g (3.375 g Intravenous Given 12/31/16 2259)     Initial Impression / Assessment and Plan / ED Course  I have reviewed the triage vital signs and the nursing notes.  Pertinent labs & imaging results that were available during my care of the patient were reviewed by me and considered in my medical decision making (see chart for details).    INCISION AND DRAINAGE Performed by: Gwyneth Sprout Consent: Verbal consent obtained. Risks and benefits: risks, benefits and alternatives were discussed Type: abscess  Body area: left lateral thigh  Anesthesia: local infiltration  Incision was made with a scalpel.  Local anesthetic: lidocaine 2% with epinephrine  Anesthetic total: 5 ml  Complexity: complex Blunt dissection to break up loculations  Drainage: purulent  Drainage amount: 50mL Packing material: 1/4 in iodoform gauze  Patient tolerance: Patient tolerated the procedure well with no immediate complications.     Patient is a healthy 46 year old male presenting today with evidence of abscess and surrounding cellulitis of the lateral left thigh. There is  no crepitus present with palpation and exam consistent with large abscess. Patient's labs are within normal limits except for white blood cell count of 15,000. Patient is febrile here but does not appear to be septic. Patient was given a dose of vancomycin however unfortunately after he received the vancomycin he had an allergic reaction with diffuse redness over his chest hives and tachycardia. This improved with Benadryl. I&D as above with large amount of pus removed. Patient will be admitted for further care and antibiotics.  Final Clinical Impressions(s) / ED Diagnoses   Final diagnoses:  Cellulitis and abscess of left leg    New Prescriptions Current Discharge Medication List       Gwyneth Sprout, MD 01/01/17 [phone]

## 2017-01-01 NOTE — Op Note (Addendum)
Operative Note  David PoundsHector Bruno Palmer  [id number]  [id number]  01/01/2017   Surgeon: Berna Buehelsea A Lacretia Tindall  Assistant: none  Procedure performed: incision and debridement left thigh infected hematoma  Preop diagnosis: infected left lateral thigh hematoma Post-op diagnosis/intraop findings: same. Final wound size 15x8cm. Wound tracks 10cm superiorly and 3cm inferio-posterior  Specimens: cultures Retained items: kerlix packing EBL: 20cc Complications: none  Description of procedure: After obtaining informed consent the patient was taken to the operating room and placed supine on operating room table wheregeneral endotracheal anesthesia was initiated, preoperative antibiotics were administered, SCDs applied, and a formal timeout was performed. The left thigh was prepped and draped in the usual sterile fashion. The prior I&D site was probed with a kelly clamp confirming a cavernous but mostly emptied abscess pocket. The skin was incised with cautery proximally and distally and the wound was bluntly probed to evacuate all loculations. A wedge of devascularized skin was excised to create a large, shallow wound.The abscess cavity tracked distally and posteriorly about 3cm and proximally 10cm.  No necrotic soft tissue but there was a fibrinous slough overlying the muscle fascia. This was scraped and fulgurated with cautery. Hemostasis was confirmed within the wound. The surrounding skin and soft tissue was infiltrated with exparel and the wound was tightly packed with a saline-moistened kerlix followed by ABD and tape. The patient was then awakened, extubated and taken to PACU in stable condition.   All counts were correct at the completion of the case.

## 2017-01-01 NOTE — Progress Notes (Signed)
I have met with this patient and examined and marked his left leg. Discussed again plan for incision and drainage, with likely post-op need for 1-2 times daily packing changes, risks of bleeding, ongoing infection, scarring, pain, and need for other procedures. He and his sister at bedside asked appropriate questions which were answered. Will proceed with I&D this afternoon.

## 2017-01-01 NOTE — Anesthesia Preprocedure Evaluation (Signed)
Anesthesia Evaluation  Patient identified by MRN, date of birth, ID band Patient awake    Reviewed: Allergy & Precautions, NPO status , Patient's Chart, lab work & pertinent test results  Airway Mallampati: II  TM Distance: >3 FB Neck ROM: Full    Dental no notable dental hx.    Pulmonary neg pulmonary ROS,    Pulmonary exam normal breath sounds clear to auscultation       Cardiovascular negative cardio ROS Normal cardiovascular exam Rhythm:Regular Rate:Tachycardia     Neuro/Psych negative neurological ROS  negative psych ROS   GI/Hepatic negative GI ROS, Neg liver ROS,   Endo/Other  Hypothyroidism   Renal/GU negative Renal ROS  negative genitourinary   Musculoskeletal negative musculoskeletal ROS (+)   Abdominal   Peds negative pediatric ROS (+)  Hematology negative hematology ROS (+)   Anesthesia Other Findings   Reproductive/Obstetrics negative OB ROS                             Anesthesia Physical Anesthesia Plan  ASA: II and emergent  Anesthesia Plan: General   Post-op Pain Management:    Induction: Intravenous  Airway Management Planned: Oral ETT  Additional Equipment:   Intra-op Plan:   Post-operative Plan: Extubation in OR  Informed Consent: I have reviewed the patients History and Physical, chart, labs and discussed the procedure including the risks, benefits and alternatives for the proposed anesthesia with the patient or authorized representative who has indicated his/her understanding and acceptance.   Dental advisory given  Plan Discussed with: CRNA  Anesthesia Plan Comments:         Anesthesia Quick Evaluation

## 2017-01-01 NOTE — Transfer of Care (Signed)
Immediate Anesthesia Transfer of Care Note  Patient: David Palmer  Procedure(s) Performed: Procedure(s): INCISION AND DRAINAGE Left thigh ABSCESS (Left)  Patient Location: PACU  Anesthesia Type:General  Level of Consciousness: awake and alert   Airway & Oxygen Therapy: Patient Spontanous Breathing and Patient connected to face mask oxygen  Post-op Assessment: Report given to RN and Post -op Vital signs reviewed and stable  Post vital signs: Reviewed and stable  Last Vitals:  Vitals:   01/01/17 0524 01/01/17 1258  BP: 116/65 133/68  Pulse: (!) 109 (!) 119  Resp: 19 18  Temp: 37.4 C 37.5 C    Last Pain:  Vitals:   01/01/17 1258  TempSrc: Oral  PainSc:       Patients Stated Pain Goal: 2 (01/01/17 1033)  Complications: No apparent anesthesia complications

## 2017-01-01 NOTE — Consult Note (Signed)
[facility] Palmer Surgery Consult Note  David Palmer 09-03-1971  [id number].    Requesting MD: Sloan Leiter, MD  Chief Complaint/Reason for Consult: left thigh abscess and hematoma   HPI:  David Palmer is a 46 year-old male with a medical history of hypothyroidism who presented to the ED 12/31/16 with pain/swelling of his left thigh. He reports that two weeks ago he had a fall at work resulting in some abrasions and left knee swelling. 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 He was evaluated by an orthopedic surgeon and radiographs were negative for acute osseus injury. The swelling persisted and over the past 48-72 hours patient developed fevers, chills, and erythema/warmth of left thigh. ED workup significant for leukocytosis (15.2), sinus tachycardia, and left thigh tenderness/swelling. The patient underwent incision and drainage of left lateral thigh abscess in the ED (100 cc purulence was expressed) and was admitted by internal medicine for IV antibiotics (vanc/zosyn) and observation. Subsequent MRI revealed a persistent 5 x 5.6 x 1.1 cm abscess.and a 6.3 x 3.9 x 1.3 cm hematoma located proximal to the abscess. The trauma team has been asked to evaluate the patient for possible OR drainage of abscess/hematoma. He reports a history of inguinal hernia repair and denies complications under general anesthesia. Denies use of blood thinning medications. He reports red rash after receiving IV abx this admission. He reports eating a piece of bread around 0800 today.  ROS: Review of Systems  Constitutional: Positive for chills and fever.  Eyes: Negative for blurred vision.  Respiratory: Negative for cough, hemoptysis, shortness of breath and wheezing.   Cardiovascular: Negative for chest pain and orthopnea.  Gastrointestinal: Negative for abdominal pain, constipation, diarrhea, heartburn,  nausea and vomiting.  Genitourinary: Negative for dysuria and hematuria.  Musculoskeletal: Positive for falls.       Left thigh tenderness, erythema, warmth.  All other systems reviewed and are negative.   Family History  Problem Relation Age of Onset  . Diabetes Mellitus II Father     Past Medical History:  Diagnosis Date  . Thyroid disease     History reviewed. No pertinent surgical history.  Social History:  reports that he has never smoked. He has never used smokeless tobacco. He reports that he does not drink alcohol or use drugs.  Allergies:  Allergies  Allergen Reactions  . Vancomycin Other (See Comments)    [facility]man infusion    Medications Prior to Admission  Medication Sig Dispense Refill  . ibuprofen (ADVIL,MOTRIN) 800 MG tablet Take 800 mg by mouth 3 (three) times daily as needed (for pain).    Marland Kitchen levothyroxine (SYNTHROID, LEVOTHROID) 125 MCG tablet Take 125 mcg by mouth daily before breakfast.    . traMADol (ULTRAM) 50 MG tablet Take by mouth every 4 (four) hours as needed (for pain).      Blood pressure 116/65, pulse (!) 109, temperature 99.3 F (37.4 C), temperature source Oral, resp. rate 19, height 5' 4"  (1.626 m), weight 73.1 kg (161 lb 3.2 oz), SpO2 100 %. Physical Exam: General: pleasant, Hispanic male who is laying in bed in NAD HEENT: head is normocephalic, atraumatic. Heart: regular, rate, and rhythm.  No obvious murmurs, gallops, or rubs noted.  Palpable pedal pulses bilaterally Lungs: CTAB, no wheezes, rhonchi, or rales noted.  Respiratory effort nonlabored Abd: soft, non-tender, non-distended, bowel sounds present MS: left lower extremity  with left thigh abscess s/p I&D - 1/2 cm incision with iodoform gauze in place, draining purulent and serosanguinous fluid. There is fluctuance over the abscess along with surrounding induration and significant surrounding erythema. Firm hematoma palpable proximal to abscess as described by MRI. Skin: warm and  dry Psych: A&Ox3 with an appropriate affect.  Results for orders placed or performed during the hospital encounter of 12/31/16 (from the past 48 hour(s))  Comprehensive metabolic panel     Status: Abnormal   Collection Time: 12/31/16  5:19 PM  Result Value Ref Range   Sodium 137 135 - 145 mmol/L   Potassium 3.7 3.5 - 5.1 mmol/L   Chloride 103 101 - 111 mmol/L   CO2 25 22 - 32 mmol/L   Glucose, Bld 112 (H) 65 - 99 mg/dL   BUN 9 6 - 20 mg/dL   Creatinine, Ser 1.03 0.61 - 1.24 mg/dL   Calcium 9.0 8.9 - 10.3 mg/dL   Total Protein 7.2 6.5 - 8.1 g/dL   Albumin 4.0 3.5 - 5.0 g/dL   AST 26 15 - 41 U/L   ALT 26 17 - 63 U/L   Alkaline Phosphatase 51 38 - 126 U/L   Total Bilirubin 1.3 (H) 0.3 - 1.2 mg/dL   GFR calc non Af Amer >60 >60 mL/min   GFR calc Af Amer >60 >60 mL/min    Comment: (NOTE) The eGFR has been calculated using the CKD EPI equation. This calculation has not been validated in all clinical situations. eGFR's persistently <60 mL/min signify possible Chronic Kidney Disease.    Anion gap 9 5 - 15  CBC with Differential     Status: Abnormal   Collection Time: 12/31/16  5:19 PM  Result Value Ref Range   WBC 15.2 (H) 4.0 - 10.5 K/uL   RBC 4.46 4.22 - 5.81 MIL/uL   Hemoglobin 13.0 13.0 - 17.0 g/dL   HCT 38.2 (L) 39.0 - 52.0 %   MCV 85.7 78.0 - 100.0 fL   MCH 29.1 26.0 - 34.0 pg   MCHC 34.0 30.0 - 36.0 g/dL   RDW 14.2 11.5 - 15.5 %   Platelets 234 150 - 400 K/uL   Neutrophils Relative % 91 %   Neutro Abs 13.8 (H) 1.7 - 7.7 K/uL   Lymphocytes Relative 6 %   Lymphs Abs 0.9 0.7 - 4.0 K/uL   Monocytes Relative 3 %   Monocytes Absolute 0.5 0.1 - 1.0 K/uL   Eosinophils Relative 0 %   Eosinophils Absolute 0.0 0.0 - 0.7 K/uL   Basophils Relative 0 %   Basophils Absolute 0.0 0.0 - 0.1 K/uL  I-Stat CG4 Lactic Acid, ED     Status: None   Collection Time: 12/31/16  5:41 PM  Result Value Ref Range   Lactic Acid, Venous 0.94 0.5 - 1.9 mmol/L  I-Stat CG4 Lactic Acid, ED      Status: None   Collection Time: 12/31/16  8:44 PM  Result Value Ref Range   Lactic Acid, Venous 1.75 0.5 - 1.9 mmol/L  Basic metabolic panel     Status: Abnormal   Collection Time: 01/01/17  6:31 AM  Result Value Ref Range   Sodium 135 135 - 145 mmol/L   Potassium 3.2 (L) 3.5 - 5.1 mmol/L   Chloride 103 101 - 111 mmol/L   CO2 25 22 - 32 mmol/L   Glucose, Bld 128 (H) 65 - 99 mg/dL   BUN 7 6 - 20 mg/dL   Creatinine, Ser  0.85 0.61 - 1.24 mg/dL   Calcium 8.0 (L) 8.9 - 10.3 mg/dL   GFR calc non Af Amer >60 >60 mL/min   GFR calc Af Amer >60 >60 mL/min    Comment: (NOTE) The eGFR has been calculated using the CKD EPI equation. This calculation has not been validated in all clinical situations. eGFR's persistently <60 mL/min signify possible Chronic Kidney Disease.    Anion gap 7 5 - 15  CBC     Status: Abnormal   Collection Time: 01/01/17  6:31 AM  Result Value Ref Range   WBC 13.1 (H) 4.0 - 10.5 K/uL   RBC 3.85 (L) 4.22 - 5.81 MIL/uL   Hemoglobin 11.2 (L) 13.0 - 17.0 g/dL   HCT 33.0 (L) 39.0 - 52.0 %   MCV 85.7 78.0 - 100.0 fL   MCH 29.1 26.0 - 34.0 pg   MCHC 33.9 30.0 - 36.0 g/dL   RDW 14.5 11.5 - 15.5 %   Platelets 208 150 - 400 K/uL   Mr Femur Left Wo Contrast  Result Date: 01/01/2017 CLINICAL DATA:  Patient fell at work on 12/14/2016. Two days ago, patient started noticing swelling, erythema and warmth of the leg. Fever to 103. EXAM: MR OF THE LEFT FEMUR WITHOUT CONTRAST TECHNIQUE: Multiplanar, multisequence MR imaging of the left femur/thigh was performed. No intravenous contrast was administered. COMPARISON:  None. FINDINGS: Bones/Joint/Cartilage The hip joints are maintained. No bone marrow edema, fracture or bone destruction. No focal chondral defect identified. Small effusions of the visualized left hip and both knees. Ligaments Intact Muscles and Tendons No intramuscular hemorrhage, abscess or mass. Posttraumatic fluid outlines the biceps femoris. Soft tissues Cellulitis of  the subcutaneous fat of the left thigh with soft tissue edema overlying the left vastus lateralis muscle. There are several simple and complex fluid collections identified overlying the left vastus lateralis, the more anterolateral is simple in appearance spanning 24.7 cm craniocaudad by 1.3 cm in thickness. Overlying this is a fluid-hematocrit containing fluid collection consistent with a hematoma measuring 6.3 x 3.9 x 1.3 cm at the level of the midthigh. Of concern however is a 5 x 5.6 x 1.1 cm collection which appears to contain an air-fluid level along the lateral aspect of the left midthigh concerning for an abscess. This is seen approximately 18.5 cm distal to the greater trochanter. IMPRESSION: 1. Air-fluid level within a subcutaneous left lateral mid thigh fluid collection measuring 5 x 5.6 x 1.1 cm concerning for soft tissue abscess in the setting of post traumatic soft tissue edema/cellulitis. 2. Adjacent slightly more proximal hematoma with fluid -hematocrit level measuring 6.3 x 3.9 x 1.3 cm also along the lateral aspect of the left mid thigh. 3. Posttraumatic simple fluid overlies the vastus lateralis and biceps femoris muscles. No underlying bone destruction nor fracture identified. Electronically Signed   By: Ashley Royalty M.D.   On: 01/01/2017 03:31   Dg Femur Min 2 Views Left  Result Date: 12/31/2016 CLINICAL DATA:  stepped into a deep hole on 1/26 while at work. He states that he has swelling on the medial aspect of the knee and lateral aspect of the thigh. The pain is worse with bending of the knee and with weightbearing and feels cold at times. EXAM: LEFT FEMUR 2 VIEWS COMPARISON:  None. FINDINGS: There is no acute fracture or subluxation. Soft tissue edema is noted throughout the thigh, focally prominent in the lateral aspect of the midthigh. No radiopaque foreign body identified however. IMPRESSION: 1.  No  evidence for acute fracture. 2. Soft tissue swelling. Electronically Signed   By:  Nolon Nations M.D.   On: 12/31/2016 19:53      Assessment/Plan Abscess left thigh s/p bedside incision and drainage 12/31/16 in ED  Hematoma left thigh Hypothyroidism - synthroid  FEN: NPO, IVF ID: Vancoymcin, Zosyn 2/12 >>  VTE: SCD's   Plan: continue NPO and IV abx. Recommend OR today for further incision and drainage under general anesthesia. Will confirm treatment plan with MD.    Jill Alexanders, [facility] Surgery 01/01/2017, 8:38 AM Pager: [phone] Consults: [phone] Mon-Fri 7:00 am-4:30 pm Sat-Sun 7:00 am-11:30 am

## 2017-01-01 NOTE — Anesthesia Procedure Notes (Signed)
Procedure Name: Intubation Date/Time: 01/01/2017 2:46 PM Performed by: Rejeana Brock L Pre-anesthesia Checklist: Patient identified, Emergency Drugs available, Suction available and Patient being monitored Patient Re-evaluated:Patient Re-evaluated prior to inductionOxygen Delivery Method: Circle System Utilized Preoxygenation: Pre-oxygenation with 100% oxygen Intubation Type: IV induction Ventilation: Mask ventilation without difficulty Laryngoscope Size: Mac and 4 Grade View: Grade I Tube type: Oral Tube size: 7.5 mm Number of attempts: 1 Airway Equipment and Method: Stylet and Oral airway Placement Confirmation: ETT inserted through vocal cords under direct vision,  positive ETCO2 and breath sounds checked- equal and bilateral Secured at: 22 cm Tube secured with: Tape Dental Injury: Teeth and Oropharynx as per pre-operative assessment

## 2017-01-01 NOTE — Anesthesia Postprocedure Evaluation (Addendum)
Anesthesia Post Note  Patient: David Palmer  Procedure(s) Performed: Procedure(s) (LRB): INCISION AND DRAINAGE Left thigh ABSCESS (Left)  Patient location during evaluation: PACU Anesthesia Type: General Level of consciousness: sedated Pain management: pain level controlled Vital Signs Assessment: post-procedure vital signs reviewed and stable Respiratory status: spontaneous breathing and respiratory function stable Cardiovascular status: stable Anesthetic complications: no       Last Vitals:  Vitals:   01/01/17 1530 01/01/17 1554  BP: 130/68 137/78  Pulse: 98 (!) 113  Resp: 13 13  Temp: 36.6 C     Last Pain:  Vitals:   01/01/17 1553  TempSrc:   PainSc: 5                  Lenward Able DANIEL

## 2017-01-01 NOTE — Progress Notes (Addendum)
PROGRESS NOTE        PATIENT DETAILS Name: David Palmer Age: 46 y.o. Sex: male Date of Birth: 1971-05-08 Admit Date: 12/31/2016 Admitting Physician Eduard Clos, MD PCP:Pcp Not In System  Brief Narrative: Patient is a 46 y.o. male with left upper leg abscess and cellulitis. He started experiencing edema, erythema, and pain in his left thigh after falling on 1/26 and scraping his leg. Symptoms worsened significantly in the past 2 days. He also had fever up to 103. He was seen in the ED on 2/12 and they attempted I&D. He was admitted for further treatment. Surgery was consulted for more extensive I&D. X-ray of left knee was normal. Spanish is primary language but he speaks enough English to communicate.   Subjective: He is doing well without much pain.   Assessment/Plan: Cellulitis with abscess of left thigh: Edema, erythema, and pain worsened over the past 2 days s/p fall on 1/26. Surgery consulted for I&D. He had a piece of bread this morning so will wait until the afternoon to operate. Continue  Vancomycin-but will discontinue Zosyn.   Hypothyroidism: Continue Synthroid.   DVT Prophylaxis: None  Code Status: Full code  Family Communication: Spouse at bedside  Disposition Plan: Remain inpatient-but will plan on Home health on discharge  Antimicrobial agents: Anti-infectives    Start     Dose/Rate Route Frequency Ordered Stop   01/01/17 0800  vancomycin (VANCOCIN) IVPB 1000 mg/200 mL premix     1,000 mg 100 mL/hr over 120 Minutes Intravenous Every 12 hours 12/31/16 2236     01/01/17 0600  piperacillin-tazobactam (ZOSYN) IVPB 3.375 g     3.375 g 12.5 mL/hr over 240 Minutes Intravenous Every 8 hours 12/31/16 2236     12/31/16 2230  piperacillin-tazobactam (ZOSYN) IVPB 3.375 g     3.375 g 100 mL/hr over 30 Minutes Intravenous  Once 12/31/16 2218 12/31/16 2329   12/31/16 1945  vancomycin (VANCOCIN) IVPB 1000 mg/200 mL premix     1,000  mg 200 mL/hr over 60 Minutes Intravenous  Once 12/31/16 1933 12/31/16 2033      Procedures: None  CONSULTS:  general surgery  Time spent: 25-30 minutes-Greater than 50% of this time was spent in counseling, explanation of diagnosis, planning of further management, and coordination of care.  MEDICATIONS: Scheduled Meds: . diphenhydrAMINE  25 mg Intravenous Q12H   And  . vancomycin  1,000 mg Intravenous Q12H  . levothyroxine  125 mcg Oral QAC breakfast  . piperacillin-tazobactam (ZOSYN)  IV  3.375 g Intravenous Q8H  . potassium chloride (KCL MULTIRUN) 30 mEq in 265 mL IVPB  30 mEq Intravenous Once   Continuous Infusions: . sodium chloride 125 mL/hr at 01/01/17 0534   PRN Meds:.acetaminophen **OR** acetaminophen, ondansetron **OR** ondansetron (ZOFRAN) IV, traMADol   PHYSICAL EXAM: Vital signs: Vitals:   12/31/16 2045 12/31/16 2100 12/31/16 2225 01/01/17 0524  BP: 131/78 117/70 (!) 115/55 116/65  Pulse: 114 112 96 (!) 109  Resp: 14 20 19 19   Temp:   98.8 F (37.1 C) 99.3 F (37.4 C)  TempSrc:   Oral Oral  SpO2: 100% 100% 100% 100%  Weight:   73.1 kg (161 lb 3.2 oz)   Height:   5\' 4"  (1.626 m)    Filed Weights   12/31/16 2225  Weight: 73.1 kg (161 lb 3.2 oz)   Body  mass index is 27.67 kg/m.   General appearance :Awake, alert, not in any distress. Speech Clear. Not toxic Looking Eyes:, pupils equally reactive to light and accomodation,no scleral icterus.Pink conjunctiva HEENT: Atraumatic and Normocephalic Neck: supple, no JVD. No cervical lymphadenopathy. No thyromegaly Resp:Good air entry bilaterally, no added sounds  CVS: S1 S2 regular, no murmurs.  GI: Bowel sounds present, Non tender and not distended with no gaurding, rigidity or rebound.No organomegaly Extremities: Left upper outer leg with 5 cm area of erythema, edema, and warmth. Right leg without edema. Both legs are warm to touch Neurology:  speech clear,Non focal, sensation is grossly  intact. Psychiatric: Normal judgment and insight. Alert and oriented x 3. Normal mood. Musculoskeletal:No digital cyanosis Skin:No Rash, warm and dry Wounds:N/A  I have personally reviewed following labs and imaging studies  LABORATORY DATA: CBC:  Recent Labs Lab 12/31/16 1719 01/01/17 0631  WBC 15.2* 13.1*  NEUTROABS 13.8*  --   HGB 13.0 11.2*  HCT 38.2* 33.0*  MCV 85.7 85.7  PLT 234 208    Basic Metabolic Panel:  Recent Labs Lab 12/31/16 1719 01/01/17 0631  NA 137 135  K 3.7 3.2*  CL 103 103  CO2 25 25  GLUCOSE 112* 128*  BUN 9 7  CREATININE 1.03 0.85  CALCIUM 9.0 8.0*    GFR: Estimated Creatinine Clearance: 97.4 mL/min (by C-G formula based on SCr of 0.85 mg/dL).  Liver Function Tests:  Recent Labs Lab 12/31/16 1719  AST 26  ALT 26  ALKPHOS 51  BILITOT 1.3*  PROT 7.2  ALBUMIN 4.0   No results for input(s): LIPASE, AMYLASE in the last 168 hours. No results for input(s): AMMONIA in the last 168 hours.  Coagulation Profile: No results for input(s): INR, PROTIME in the last 168 hours.  Cardiac Enzymes: No results for input(s): CKTOTAL, CKMB, CKMBINDEX, TROPONINI in the last 168 hours.  BNP (last 3 results) No results for input(s): PROBNP in the last 8760 hours.  HbA1C: No results for input(s): HGBA1C in the last 72 hours.  CBG: No results for input(s): GLUCAP in the last 168 hours.  Lipid Profile: No results for input(s): CHOL, HDL, LDLCALC, TRIG, CHOLHDL, LDLDIRECT in the last 72 hours.  Thyroid Function Tests: No results for input(s): TSH, T4TOTAL, FREET4, T3FREE, THYROIDAB in the last 72 hours.  Anemia Panel: No results for input(s): VITAMINB12, FOLATE, FERRITIN, TIBC, IRON, RETICCTPCT in the last 72 hours.  Urine analysis: No results found for: COLORURINE, APPEARANCEUR, LABSPEC, PHURINE, GLUCOSEU, HGBUR, BILIRUBINUR, KETONESUR, PROTEINUR, UROBILINOGEN, NITRITE, LEUKOCYTESUR  Sepsis Labs: Lactic Acid, Venous    Component Value  Date/Time   LATICACIDVEN 1.75 12/31/2016 2044    MICROBIOLOGY: No results found for this or any previous visit (from the past 240 hour(s)).  RADIOLOGY STUDIES/RESULTS: Mr Femur Left Wo Contrast  Result Date: 01/01/2017 CLINICAL DATA:  Patient fell at work on 12/14/2016. Two days ago, patient started noticing swelling, erythema and warmth of the leg. Fever to 103. EXAM: MR OF THE LEFT FEMUR WITHOUT CONTRAST TECHNIQUE: Multiplanar, multisequence MR imaging of the left femur/thigh was performed. No intravenous contrast was administered. COMPARISON:  None. FINDINGS: Bones/Joint/Cartilage The hip joints are maintained. No bone marrow edema, fracture or bone destruction. No focal chondral defect identified. Small effusions of the visualized left hip and both knees. Ligaments Intact Muscles and Tendons No intramuscular hemorrhage, abscess or mass. Posttraumatic fluid outlines the biceps femoris. Soft tissues Cellulitis of the subcutaneous fat of the left thigh with soft tissue edema overlying the  left vastus lateralis muscle. There are several simple and complex fluid collections identified overlying the left vastus lateralis, the more anterolateral is simple in appearance spanning 24.7 cm craniocaudad by 1.3 cm in thickness. Overlying this is a fluid-hematocrit containing fluid collection consistent with a hematoma measuring 6.3 x 3.9 x 1.3 cm at the level of the midthigh. Of concern however is a 5 x 5.6 x 1.1 cm collection which appears to contain an air-fluid level along the lateral aspect of the left midthigh concerning for an abscess. This is seen approximately 18.5 cm distal to the greater trochanter. IMPRESSION: 1. Air-fluid level within a subcutaneous left lateral mid thigh fluid collection measuring 5 x 5.6 x 1.1 cm concerning for soft tissue abscess in the setting of post traumatic soft tissue edema/cellulitis. 2. Adjacent slightly more proximal hematoma with fluid -hematocrit level measuring 6.3 x 3.9  x 1.3 cm also along the lateral aspect of the left mid thigh. 3. Posttraumatic simple fluid overlies the vastus lateralis and biceps femoris muscles. No underlying bone destruction nor fracture identified. Electronically Signed   By: Tollie Ethavid  Kwon M.D.   On: 01/01/2017 03:31   Dg Knee Complete 4 Views Left  Result Date: 12/17/2016 CLINICAL DATA:  46 year old male fell 12/14/2016 twisting knee. Left knee pain. Initial encounter. EXAM: LEFT KNEE - COMPLETE 4+ VIEW COMPARISON:  None. FINDINGS: No fracture or dislocation. Patella slightly high-riding. Question radiopaque foreign body adjacent to fibular head on three views but not seen on lateral view and therefore or possibly artifact. IMPRESSION: No fracture or dislocation. Please see above. Electronically Signed   By: Lacy DuverneySteven  Olson M.D.   On: 12/17/2016 11:11   Dg Femur Min 2 Views Left  Result Date: 12/31/2016 CLINICAL DATA:  stepped into a deep hole on 1/26 while at work. He states that he has swelling on the medial aspect of the knee and lateral aspect of the thigh. The pain is worse with bending of the knee and with weightbearing and feels cold at times. EXAM: LEFT FEMUR 2 VIEWS COMPARISON:  None. FINDINGS: There is no acute fracture or subluxation. Soft tissue edema is noted throughout the thigh, focally prominent in the lateral aspect of the midthigh. No radiopaque foreign body identified however. IMPRESSION: 1.  No evidence for acute fracture. 2. Soft tissue swelling. Electronically Signed   By: Norva PavlovElizabeth  Brown M.D.   On: 12/31/2016 19:53     LOS: 1 day   Corwin LevinsSelby Rouch, PA-S  [facility] Pager:[phone]  If 7PM-7AM, please contact night-coverage [email or web address] Password [email or web address] 01/01/2017, 10:12 AM  Attending MD note  Patient was seen, examined,treatment plan was discussed with the PA-S.  I have personally reviewed the clinical findings, lab, imaging studies and management of this patient in detail. I agree with the documentation, as  recorded by the PA-S.   Patient is a 46 year old Hispanic male who presents with left leg cellulitis and a abscess. He did undergo incision and drainage in the emergency room-he continues to have significant amount of induration. MRI of the leg done post I&D in the ED she continues to show an abscess.  On Exam: Gen. exam: Awake, alert, not in any distress Chest: Good air entry bilaterally, no rhonchi or rales CVS: S1-S2 regular, no murmurs Abdomen: Soft, nontender and nondistended Neurology: Non-focal Skin: No rash or lesions  Plan Left thigh cellulitis with abscess-Gen. surgery consulted, plans are for I&D. Continue with empiric antibiotics for now  Rest as above  [facility],Loucinda Croy [facility]

## 2017-01-02 ENCOUNTER — Encounter (HOSPITAL_COMMUNITY): Payer: Self-pay | Admitting: Surgery

## 2017-01-02 LAB — CBC
HEMATOCRIT: 34.1 % — AB (ref 39.0–52.0)
Hemoglobin: 11.5 g/dL — ABNORMAL LOW (ref 13.0–17.0)
MCH: 28.9 pg (ref 26.0–34.0)
MCHC: 33.7 g/dL (ref 30.0–36.0)
MCV: 85.7 fL (ref 78.0–100.0)
PLATELETS: 241 10*3/uL (ref 150–400)
RBC: 3.98 MIL/uL — ABNORMAL LOW (ref 4.22–5.81)
RDW: 14.6 % (ref 11.5–15.5)
WBC: 12.1 10*3/uL — AB (ref 4.0–10.5)

## 2017-01-02 LAB — BASIC METABOLIC PANEL
ANION GAP: 9 (ref 5–15)
BUN: 7 mg/dL (ref 6–20)
CALCIUM: 8.3 mg/dL — AB (ref 8.9–10.3)
CO2: 26 mmol/L (ref 22–32)
Chloride: 99 mmol/L — ABNORMAL LOW (ref 101–111)
Creatinine, Ser: 0.84 mg/dL (ref 0.61–1.24)
GFR calc non Af Amer: >60 mL/min (ref >60)
Glucose, Bld: 129 mg/dL — ABNORMAL HIGH (ref 65–99)
Potassium: 3.9 mmol/L (ref 3.5–5.1)
Sodium: 134 mmol/L — ABNORMAL LOW (ref 135–145)

## 2017-01-02 MED ORDER — ENOXAPARIN SODIUM 40 MG/0.4ML ~~LOC~~ SOLN
40.0000 mg | SUBCUTANEOUS | Status: DC
  Administered 2017-01-02 – 2017-01-04 (×3): 40 mg via SUBCUTANEOUS
  Filled 2017-01-02 (×3): qty 0.4

## 2017-01-02 NOTE — Progress Notes (Addendum)
PROGRESS NOTE        PATIENT DETAILS Name: David Palmer Age: 46 y.o. Sex: male Date of Birth: [birth year] Admit Date: 12/31/2016 Admitting Physician Eduard Clos, MD PCP:Pcp Not In System  Brief Narrative: Patient is a 46 y.o. male with history of hypothyroidism, admitted with cellulitis and abscess office left upper thigh. with left upper leg abscess and cellulitis. Seen by general surgery and underwent an incision and drainage on 2/13.   Subjective: He is doing well without much pain.   Assessment/Plan: Cellulitis with abscess of left thigh: Underwent incision and drainage on 2/13, continue wound care and empiric antibiotics. Await culture data. Gen. surgery following and directing postop care.   Hypothyroidism: Continue Synthroid.   DVT Prophylaxis: Prophylactic Lovenox  Code Status: Full code  Family Communication: Spouse at bedside  Disposition Plan: Remain inpatient-but will plan on Home health on discharge-probably in the next 1-2 days  Antimicrobial agents: Anti-infectives    Start     Dose/Rate Route Frequency Ordered Stop   01/01/17 1515  piperacillin-tazobactam (ZOSYN) IVPB 3.375 g  Status:  Discontinued     3.375 g 12.5 mL/hr over 240 Minutes Intravenous  Once 01/01/17 1505 01/01/17 1507   01/01/17 1515  piperacillin-tazobactam (ZOSYN) IVPB 3.375 g     3.375 g 100 mL/hr over 30 Minutes Intravenous  Once 01/01/17 1508 01/01/17 1719   01/01/17 1500  piperacillin-tazobactam (ZOSYN) IVPB 3.375 g  Status:  Discontinued     3.375 g 100 mL/hr over 30 Minutes Intravenous  Once 01/01/17 1457 01/01/17 1505   01/01/17 0800  vancomycin (VANCOCIN) IVPB 1000 mg/200 mL premix     1,000 mg 100 mL/hr over 120 Minutes Intravenous Every 12 hours 12/31/16 2236     01/01/17 0600  piperacillin-tazobactam (ZOSYN) IVPB 3.375 g  Status:  Discontinued     3.375 g 12.5 mL/hr over 240 Minutes Intravenous Every 8 hours 12/31/16 2236 01/01/17 1132    12/31/16 2230  piperacillin-tazobactam (ZOSYN) IVPB 3.375 g     3.375 g 100 mL/hr over 30 Minutes Intravenous  Once 12/31/16 2218 12/31/16 2329   12/31/16 1945  vancomycin (VANCOCIN) IVPB 1000 mg/200 mL premix     1,000 mg 200 mL/hr over 60 Minutes Intravenous  Once 12/31/16 1933 12/31/16 2033      Procedures: None  CONSULTS:  general surgery  Time spent: 25-30 minutes-Greater than 50% of this time was spent in counseling, explanation of diagnosis, planning of further management, and coordination of care.  MEDICATIONS: Scheduled Meds: . diphenhydrAMINE  25 mg Intravenous Q12H   And  . vancomycin  1,000 mg Intravenous Q12H  . levothyroxine  125 mcg Oral QAC breakfast   Continuous Infusions:  PRN Meds:.acetaminophen **OR** acetaminophen, HYDROmorphone (DILAUDID) injection, ondansetron **OR** ondansetron (ZOFRAN) IV, traMADol   PHYSICAL EXAM: Vital signs: Vitals:   01/01/17 1631 01/01/17 2229 01/02/17 0219 01/02/17 0504  BP: 100/60 110/66 109/69 120/66  Pulse: (!) 111 92 79 83  Resp: 15 16 16 16   Temp: 98.5 F (36.9 C) 98.5 F (36.9 C) 98.5 F (36.9 C) 98.3 F (36.8 C)  TempSrc: Oral Oral Oral Oral  SpO2: 99% 98% 94% 99%  Weight:      Height:       Filed Weights   12/31/16 2225  Weight: 73.1 kg (161 lb 3.2 oz)   Body mass index is 27.67  kg/m.   General appearance :Awake, alert, not in any distress. Speech Clear. Not toxic Looking Eyes:, pupils equally reactive to light and accomodation,no scleral icterus.Pink conjunctiva HEENT: Atraumatic and Normocephalic Neck: supple, no JVD. No cervical lymphadenopathy. No thyromegaly Resp:Good air entry bilaterally, no added sounds  CVS: S1 S2 regular, no murmurs.  GI: Bowel sounds present, Non tender and not distended with no gaurding, rigidity or rebound.No organomegaly Extremities: Left upper outer thight dressing in place-did not open  Neurology:  speech clear,Non focal, sensation is grossly intact. Psychiatric:  Normal judgment and insight. Alert and oriented x 3. Normal mood. Musculoskeletal:No digital cyanosis Skin:No Rash, warm and dry Wounds:N/A  I have personally reviewed following labs and imaging studies  LABORATORY DATA: CBC:  Recent Labs Lab 12/31/16 1719 01/01/17 0631 01/02/17 0516  WBC 15.2* 13.1* 12.1*  NEUTROABS 13.8*  --   --   HGB 13.0 11.2* 11.5*  HCT 38.2* 33.0* 34.1*  MCV 85.7 85.7 85.7  PLT 234 208 241    Basic Metabolic Panel:  Recent Labs Lab 12/31/16 1719 01/01/17 0631 01/02/17 0516  NA 137 135 134*  K 3.7 3.2* 3.9  CL 103 103 99*  CO2 25 25 26   GLUCOSE 112* 128* 129*  BUN 9 7 7   CREATININE 1.03 0.85 0.84  CALCIUM 9.0 8.0* 8.3*    GFR: Estimated Creatinine Clearance: 98.6 mL/min (by C-G formula based on SCr of 0.84 mg/dL).  Liver Function Tests:  Recent Labs Lab 12/31/16 1719  AST 26  ALT 26  ALKPHOS 51  BILITOT 1.3*  PROT 7.2  ALBUMIN 4.0   No results for input(s): LIPASE, AMYLASE in the last 168 hours. No results for input(s): AMMONIA in the last 168 hours.  Coagulation Profile: No results for input(s): INR, PROTIME in the last 168 hours.  Cardiac Enzymes: No results for input(s): CKTOTAL, CKMB, CKMBINDEX, TROPONINI in the last 168 hours.  BNP (last 3 results) No results for input(s): PROBNP in the last 8760 hours.  HbA1C: No results for input(s): HGBA1C in the last 72 hours.  CBG: No results for input(s): GLUCAP in the last 168 hours.  Lipid Profile: No results for input(s): CHOL, HDL, LDLCALC, TRIG, CHOLHDL, LDLDIRECT in the last 72 hours.  Thyroid Function Tests: No results for input(s): TSH, T4TOTAL, FREET4, T3FREE, THYROIDAB in the last 72 hours.  Anemia Panel: No results for input(s): VITAMINB12, FOLATE, FERRITIN, TIBC, IRON, RETICCTPCT in the last 72 hours.  Urine analysis: No results found for: COLORURINE, APPEARANCEUR, LABSPEC, PHURINE, GLUCOSEU, HGBUR, BILIRUBINUR, KETONESUR, PROTEINUR, UROBILINOGEN, NITRITE,  LEUKOCYTESUR  Sepsis Labs: Lactic Acid, Venous    Component Value Date/Time   LATICACIDVEN 1.75 12/31/2016 2044    MICROBIOLOGY: Recent Results (from the past 240 hour(s))  Surgical pcr screen     Status: None   Collection Time: 01/01/17 12:24 PM  Result Value Ref Range Status   MRSA, PCR NEGATIVE NEGATIVE Final   Staphylococcus aureus NEGATIVE NEGATIVE Final    Comment:        The Xpert SA Assay (FDA approved for NASAL specimens in patients over 90 years of age), is one component of a comprehensive surveillance program.  Test performance has been validated by [facility] for patients greater than or equal to 33 year old. It is not intended to diagnose infection nor to guide or monitor treatment.   Aerobic/Anaerobic Culture (surgical/deep wound)     Status: None (Preliminary result)   Collection Time: 01/01/17  2:55 PM  Result Value Ref Range Status  Specimen Description ABSCESS LEFT THIGH  Final   Special Requests NONE  Final   Gram Stain   Final    ABUNDANT WBC PRESENT, PREDOMINANTLY PMN FEW GRAM POSITIVE COCCI IN PAIRS IN SINGLES    Culture PENDING  Incomplete   Report Status PENDING  Incomplete    RADIOLOGY STUDIES/RESULTS: Mr Femur Left Wo Contrast  Result Date: 01/01/2017 CLINICAL DATA:  Patient fell at work on 12/14/2016. Two days ago, patient started noticing swelling, erythema and warmth of the leg. Fever to 103. EXAM: MR OF THE LEFT FEMUR WITHOUT CONTRAST TECHNIQUE: Multiplanar, multisequence MR imaging of the left femur/thigh was performed. No intravenous contrast was administered. COMPARISON:  None. FINDINGS: Bones/Joint/Cartilage The hip joints are maintained. No bone marrow edema, fracture or bone destruction. No focal chondral defect identified. Small effusions of the visualized left hip and both knees. Ligaments Intact Muscles and Tendons No intramuscular hemorrhage, abscess or mass. Posttraumatic fluid outlines the biceps femoris. Soft tissues Cellulitis  of the subcutaneous fat of the left thigh with soft tissue edema overlying the left vastus lateralis muscle. There are several simple and complex fluid collections identified overlying the left vastus lateralis, the more anterolateral is simple in appearance spanning 24.7 cm craniocaudad by 1.3 cm in thickness. Overlying this is a fluid-hematocrit containing fluid collection consistent with a hematoma measuring 6.3 x 3.9 x 1.3 cm at the level of the midthigh. Of concern however is a 5 x 5.6 x 1.1 cm collection which appears to contain an air-fluid level along the lateral aspect of the left midthigh concerning for an abscess. This is seen approximately 18.5 cm distal to the greater trochanter. IMPRESSION: 1. Air-fluid level within a subcutaneous left lateral mid thigh fluid collection measuring 5 x 5.6 x 1.1 cm concerning for soft tissue abscess in the setting of post traumatic soft tissue edema/cellulitis. 2. Adjacent slightly more proximal hematoma with fluid -hematocrit level measuring 6.3 x 3.9 x 1.3 cm also along the lateral aspect of the left mid thigh. 3. Posttraumatic simple fluid overlies the vastus lateralis and biceps femoris muscles. No underlying bone destruction nor fracture identified. Electronically Signed   By: Tollie Ethavid  Kwon M.D.   On: 01/01/2017 03:31   Dg Knee Complete 4 Views Left  Result Date: 12/17/2016 CLINICAL DATA:  46 year old male fell 12/14/2016 twisting knee. Left knee pain. Initial encounter. EXAM: LEFT KNEE - COMPLETE 4+ VIEW COMPARISON:  None. FINDINGS: No fracture or dislocation. Patella slightly high-riding. Question radiopaque foreign body adjacent to fibular head on three views but not seen on lateral view and therefore or possibly artifact. IMPRESSION: No fracture or dislocation. Please see above. Electronically Signed   By: Lacy DuverneySteven  Olson M.D.   On: 12/17/2016 11:11   Dg Femur Min 2 Views Left  Result Date: 12/31/2016 CLINICAL DATA:  stepped into a deep hole on 1/26 while at  work. He states that he has swelling on the medial aspect of the knee and lateral aspect of the thigh. The pain is worse with bending of the knee and with weightbearing and feels cold at times. EXAM: LEFT FEMUR 2 VIEWS COMPARISON:  None. FINDINGS: There is no acute fracture or subluxation. Soft tissue edema is noted throughout the thigh, focally prominent in the lateral aspect of the midthigh. No radiopaque foreign body identified however. IMPRESSION: 1.  No evidence for acute fracture. 2. Soft tissue swelling. Electronically Signed   By: Norva PavlovElizabeth  Brown M.D.   On: 12/31/2016 19:53     LOS: 2 days  [facility] Pager:[phone]  If 7PM-7AM, please contact night-coverage [email or web address] Password [facility] 01/02/2017, 1:41 PM

## 2017-01-02 NOTE — Care Management (Addendum)
Called Adjustor Luvenia HellerLindsey Berner phone [phone] ,  left multiple messages . No call back as of yet. Spoke with patient and family , explained NCM will not be able to arrange home health until [employer] approves.  Patient gave NCM phone number for his employers human resources [phone] ( [employer]) , left message . Awaiting call back.   Spoke with Kendal HymenBonnie  at [employer] [phone].  She wants op note H and P and home health orders faxed [phone] to her she will [facility] , and NCM to call Jonnie Kindarolyn Johnston at [phone].   Faxed progress notes that were requested . Will fax home health orders when signed by MD. MD aware.   Called  Jonnie Kindarolyn Johnston transferred to [employer] Gonzales . [facility] Mar[facility] 01-[facility]-17 . Ms Marcos EkeGonzales requesting all information and orders be faxed to her at [phone] her direct phone number is [phone]. Fax sent   Ronny FlurryHeather Leydi Winstead RN BSN [phone]

## 2017-01-02 NOTE — Progress Notes (Signed)
LOS: 2 days   Subjective: POD1 - non english speaking patient. Family member at bedside providing translation. Patient reports some throat hoarseness, soreness that started yesterday. It is improved today. He feels his pain is currently well controlled. No other complaints.   Denies SOB, chest pain, nausea, vomiting   Objective: Vital signs in last 24 hours: Temp:  [97.9 F (36.6 C)-99.5 F (37.5 C)] 98.3 F (36.8 C) (02/14 0504) Pulse Rate:  [79-119] 83 (02/14 0504) Resp:  [13-18] 16 (02/14 0504) BP: (100-137)/(60-78) 120/66 (02/14 0504) SpO2:  [94 %-100 %] 99 % (02/14 0504) Last BM Date: 12/30/16   Laboratory  CBC  Recent Labs  01/01/17 0631 01/02/17 0516  WBC 13.1* 12.1*  HGB 11.2* 11.5*  HCT 33.0* 34.1*  PLT 208 241   BMET  Recent Labs  01/01/17 0631 01/02/17 0516  NA 135 134*  K 3.2* 3.9  CL 103 99*  CO2 25 26  GLUCOSE 128* 129*  BUN 7 7  CREATININE 0.85 0.84  CALCIUM 8.0* 8.3*     Physical Exam General appearance: alert, afebrile, no acute distress Head: normocephalic, atraumatic Resp: normal breathing effort, clear to auscultation Cardio: regular rate and rhythm, no murmur, rub, gallop appreciated GI: bowel sounds present, non tender, non distended Extremities: SCDs in place, warm, no cyanosis. LLE wound dressing with some drainage, some swelling of LLE Pulses: 2+ and symmetric Incision/Wound: packing and dressing changed, post-op open wound without purulence, non odorous   Assessment/Plan: Fall Hematoma left thigh, infected Abscess left thigh - s/p bedside I&D 02/12, s/p OR I&D 02/12. Routine dressing changes, pain management Hypothyroidism - synthroid  FEN - currently regular diet ID - Vancoymcin VTE - SCD's   Plan: pain management, PT/OT eval and treat, dressing changes   Gerald DexterLogan Orvella Digiulio, PA-Student General Trauma PA Pager: [phone]  01/02/2017

## 2017-01-03 ENCOUNTER — Ambulatory Visit (INDEPENDENT_AMBULATORY_CARE_PROVIDER_SITE_OTHER): Payer: Self-pay | Admitting: Orthopaedic Surgery

## 2017-01-03 MED ORDER — HYDROMORPHONE HCL 2 MG/ML IJ SOLN
1.0000 mg | Freq: Once | INTRAMUSCULAR | Status: AC
  Administered 2017-01-03: 1 mg via INTRAVENOUS
  Filled 2017-01-03: qty 1

## 2017-01-03 NOTE — Consult Note (Addendum)
WOC Nurse wound consult note Surgical team following for assessment and plan of care to left thigh.  Requested to apply Vac dressing. Wound type: Full thickness post-op wound Measurement: 8X6.5X2 cm with tunneling at 12:00 to inner wound with depth of 6 cm. Wound bed: 90% red, 10% yellow/black slough near wound edges Drainage (amount, consistency, odor) Small amt yellow drainage, no odor Periwound: Intact skin surrounding, generalized erythremia and edema to left thigh Dressing procedure/placement/frequency: Pt medicated for pain during procedure; applied one piece of white foam to inner tunneling area, then one piece of black foam over the wound bed to 125mm cont suction.  Pt tolerated well with minimal amt discomfort.  Plan for bedside nurses to change dressings Q Thurs/Sat/Tues.  Discussed plan of care with daughter via phone call, who translated questions for her father. Please re-consult if further assistance is needed.  Thank-you,  Cammie Mcgeeawn Mykelti Goldenstein MSN, RN, CWOCN, GustineWCN-AP, CNS [phone]

## 2017-01-03 NOTE — Progress Notes (Signed)
Pharmacy Antibiotic Note  David Palmer is a 46 y.o. male admitted on 12/31/2016 with L-thigh cellulitis. Pharmacy has been consulted for Vancomycin and Zosyn dosing. The patient had a "redman reaction" to Vancomycin in the ED so will pre-medicate with benadryl and infuse more slowly to prevent this from occurring. Today is day #4 of vancomycin and zosyn.    Plan: 1. Vancomycin 1g IV every 12 hours 2. Will pre-medicate with benadryl 25 mg IV 30 minutes prior to each Vancomycin dose 3. Zosyn 3.375g IV every 8 hours 4. Monitor renal fx, cultures, VT tomorrow  Height: 5\' 4"  (162.6 cm) Weight: 161 lb 3.2 oz (73.1 kg) IBW/kg (Calculated) : 59.2  Temp (24hrs), Avg:98.3 F (36.8 C), Min:98 F (36.7 C), Max:98.6 F (37 C)   Recent Labs Lab 12/31/16 1719 12/31/16 1741 12/31/16 2044 01/01/17 0631 01/02/17 0516  WBC 15.2*  --   --  13.1* 12.1*  CREATININE 1.03  --   --  0.85 0.84  LATICACIDVEN  --  0.94 1.75  --   --     Estimated Creatinine Clearance: 98.6 mL/min (by C-G formula based on SCr of 0.84 mg/dL).    Allergies  Allergen Reactions  . Vancomycin Other (See Comments)    Arletha Grippeedman infusion    Antimicrobials this admission: Vanc 2/12 >> Zosyn 2/12 >>  Dose adjustments this admission: N/A  Microbiology results: 2/13 L thigh abscess: GPC on gram stain 2/13 mrsa pcr: neg   Thank you for allowing pharmacy to be a part of this patient's care.    Agapito GamesAlison Lucita Montoya, PharmD, BCPS Clinical Pharmacist 01/03/2017 9:20 AM

## 2017-01-03 NOTE — Care Management (Addendum)
  Received a call from Maudie Flakesina Green with [employer] she will [facility], phone [phone] , she needs signed MD order for home health RN  Faxed to her [phone] . [facility]ian[facility] with [facility] information.   Once she receives faxed order she will send to [employer] ( company who will arrange home health ) phone [phone] .   Ronny FlurryHeather Nandini Bogdanski RN BSN       [employer] [phone].   Elvera Bickeruby Gonzales aware patient now needs VAC and dressing changes three times a week . Referred to Adjustor Luvenia HellerLindsey Berner [phone] , per Md Marcos EkeGonzales , Ms Addison BaileyBerner is back in office today .   Was also told to call nurse with [employer] Dwaine GaleJennifer Lacey [phone] for assistance with [facility].   [facility] signed [facility]. MD aware.  Faxed VAC form to [employer] , but will need  authorization  from [employer] . Left messages for Ms Addison BaileyBerner and Ms Trinidad CuretLacey   Kierstynn Babich [facility] SystemWile RN BSN [phone]

## 2017-01-03 NOTE — Care Management (Addendum)
Confirmed face sheet information with patient's daughter Efraim KaufmannMelissa . Spoke with Osborne Cascoadia at [employer] [phone] option [phone], faxed all required information including HHRN order. [employer] will review and arrange home health agency , will call tomorrow for name of home health agency .   Patient's claim number is [id number], ref number [id number] .   Information also faxed to Maudie Flakesina Green with [employer] [phone] ext [phone] , Faxed to her [phone] , as requested. Ronny FlurryHeather Felice Deem RN BSN [phone]

## 2017-01-03 NOTE — Progress Notes (Signed)
LOS: 3 days   Subjective: POD2 - patient reports overall improvement today. Tolerating regular diet. Pain well controlled. Does not report mobilizing from bed yet.   Denies chest pain, SOB, abdominal pain, nausea, vomiting.   Objective: Vital signs in last 24 hours: Temp:  [98 F (36.7 C)-98.6 F (37 C)] 98.4 F (36.9 C) (02/15 0544) Pulse Rate:  [72-96] 72 (02/15 0544) Resp:  [17] 17 (02/15 0544) BP: (106-118)/(62-64) 106/64 (02/15 0544) SpO2:  [98 %-99 %] 99 % (02/15 0544) Last BM Date: 12/30/16   Laboratory  CBC  Recent Labs  01/01/17 0631 01/02/17 0516  WBC 13.1* 12.1*  HGB 11.2* 11.5*  HCT 33.0* 34.1*  PLT 208 241   BMET  Recent Labs  01/01/17 0631 01/02/17 0516  NA 135 134*  K 3.2* 3.9  CL 103 99*  CO2 25 26  GLUCOSE 128* 129*  BUN 7 7  CREATININE 0.85 0.84  CALCIUM 8.0* 8.3*     Physical Exam General appearance: alert, afebrile, no acute distress Head: normocephalic, atraumatic Resp: normal breathing effort, clear to auscultation Cardio: regular rate and rhythm, no murmur, rub, gallop appreciated GI: bowel sounds present, non tender, non distended Extremities: SCDs in place, warm, no cyanosis. LLE wound dressing clean, dry, intact Pulses: 2+ and symmetric   Assessment/Plan: Fall Hematoma left thigh, infected Abscess left thigh - s/p bedside I&D 02/12, s/p OR I&D 02/12. Routine dressing changes, pain management, plan for possible wound vac today Hypothyroidism - synthroid  FEN - tolerating regular diet ID - Vancoymcin VTE - SCD's   Plan: pain management, PT/OT eval and treat, possible wound vac today  Gerald DexterLogan Sherilee Smotherman, PA-Student General Trauma PA Pager: [phone]  01/03/2017

## 2017-01-03 NOTE — Progress Notes (Signed)
PROGRESS NOTE        PATIENT DETAILS Name: David Palmer Age: 46 y.o. Sex: male Date of Birth: October 10, 1971 Admit Date: 12/31/2016 Admitting Physician Eduard Clos, MD [facility]  Brief Narrative: Patient is a 46 y.o. male with history of hypothyroidism, admitted with cellulitis and abscess office left upper thigh. with left upper leg abscess and cellulitis. Seen by general surgery and underwent an incision and drainage on 2/13.   Subjective: He is doing well without much pain.   Assessment/Plan: Cellulitis with abscess of left thigh: Underwent incision and drainage on 2/13, continue wound care and empiric antibiotics. Await culture data. Gen. surgery following and directing postop care-plans are to place a wound VAC today.  Hypothyroidism: Continue Synthroid.   DVT Prophylaxis: Prophylactic Lovenox  Code Status: Full code  Family Communication: Spouse at bedside  Disposition Plan: Remain inpatient-home when cleared by general surgery  Antimicrobial agents: Anti-infectives    Start     Dose/Rate Route Frequency Ordered Stop   01/01/17 1515  piperacillin-tazobactam (ZOSYN) IVPB 3.375 g  Status:  Discontinued     3.375 g 12.5 mL/hr over 240 Minutes Intravenous  Once 01/01/17 1505 01/01/17 1507   01/01/17 1515  piperacillin-tazobactam (ZOSYN) IVPB 3.375 g     3.375 g 100 mL/hr over 30 Minutes Intravenous  Once 01/01/17 1508 01/01/17 1719   01/01/17 1500  piperacillin-tazobactam (ZOSYN) IVPB 3.375 g  Status:  Discontinued     3.375 g 100 mL/hr over 30 Minutes Intravenous  Once 01/01/17 1457 01/01/17 1505   01/01/17 0800  vancomycin (VANCOCIN) IVPB 1000 mg/200 mL premix     1,000 mg 100 mL/hr over 120 Minutes Intravenous Every 12 hours 12/31/16 2236     01/01/17 0600  piperacillin-tazobactam (ZOSYN) IVPB 3.375 g  Status:  Discontinued     3.375 g 12.5 mL/hr over 240 Minutes Intravenous Every 8 hours 12/31/16 2236  01/01/17 1132   12/31/16 2230  piperacillin-tazobactam (ZOSYN) IVPB 3.375 g     3.375 g 100 mL/hr over 30 Minutes Intravenous  Once 12/31/16 2218 12/31/16 2329   12/31/16 1945  vancomycin (VANCOCIN) IVPB 1000 mg/200 mL premix     1,000 mg 200 mL/hr over 60 Minutes Intravenous  Once 12/31/16 1933 12/31/16 2033      Procedures: None  CONSULTS:  general surgery  Time spent: 25-30 minutes-Greater than 50% of this time was spent in counseling, explanation of diagnosis, planning of further management, and coordination of care.  MEDICATIONS: Scheduled Meds: . diphenhydrAMINE  25 mg Intravenous Q12H   And  . vancomycin  1,000 mg Intravenous Q12H  . enoxaparin (LOVENOX) injection  40 mg Subcutaneous Q24H  . levothyroxine  125 mcg Oral QAC breakfast   Continuous Infusions:  PRN Meds:.acetaminophen **OR** acetaminophen, HYDROmorphone (DILAUDID) injection, ondansetron **OR** ondansetron (ZOFRAN) IV, traMADol   PHYSICAL EXAM: Vital signs: Vitals:   01/02/17 0504 01/02/17 1412 01/02/17 2216 01/03/17 0544  BP: 120/66 118/63 109/62 106/64  Pulse: 83 96 81 72  Resp: 16 17 17 17   Temp: 98.3 F (36.8 C) 98 F (36.7 C) 98.6 F (37 C) 98.4 F (36.9 C)  TempSrc: Oral Oral Oral Oral  SpO2: 99% 99% 98% 99%  Weight:      Height:       Filed Weights   12/31/16 2225  Weight: 73.1 kg (161 lb 3.2  oz)   Body mass index is 27.67 kg/m.   General appearance :Awake, alert, not in any distress. Speech Clear. Not toxic Looking Eyes:, pupils equally reactive to light and accomodation,no scleral icterus.Pink conjunctiva HEENT: Atraumatic and Normocephalic Neck: supple, no JVD. No cervical lymphadenopathy. No thyromegaly Resp:Good air entry bilaterally, no added sounds  CVS: S1 S2 regular, no murmurs.  GI: Bowel sounds present, Non tender and not distended with no gaurding, rigidity or rebound.No organomegaly Extremities: Left upper outer thight dressing in place-did not open  Neurology:   speech clear,Non focal, sensation is grossly intact. Psychiatric: Normal judgment and insight. Alert and oriented x 3. Normal mood. Musculoskeletal:No digital cyanosis Skin:No Rash, warm and dry Wounds:N/A  I have personally reviewed following labs and imaging studies  LABORATORY DATA: CBC:  Recent Labs Lab 12/31/16 1719 01/01/17 0631 01/02/17 0516  WBC 15.2* 13.1* 12.1*  NEUTROABS 13.8*  --   --   HGB 13.0 11.2* 11.5*  HCT 38.2* 33.0* 34.1*  MCV 85.7 85.7 85.7  PLT 234 208 241    Basic Metabolic Panel:  Recent Labs Lab 12/31/16 1719 01/01/17 0631 01/02/17 0516  NA 137 135 134*  K 3.7 3.2* 3.9  CL 103 103 99*  CO2 25 25 26   GLUCOSE 112* 128* 129*  BUN 9 7 7   CREATININE 1.03 0.85 0.84  CALCIUM 9.0 8.0* 8.3*    GFR: Estimated Creatinine Clearance: 98.6 mL/min (by C-G formula based on SCr of 0.84 mg/dL).  Liver Function Tests:  Recent Labs Lab 12/31/16 1719  AST 26  ALT 26  ALKPHOS 51  BILITOT 1.3*  PROT 7.2  ALBUMIN 4.0   No results for input(s): LIPASE, AMYLASE in the last 168 hours. No results for input(s): AMMONIA in the last 168 hours.  Coagulation Profile: No results for input(s): INR, PROTIME in the last 168 hours.  Cardiac Enzymes: No results for input(s): CKTOTAL, CKMB, CKMBINDEX, TROPONINI in the last 168 hours.  BNP (last 3 results) No results for input(s): PROBNP in the last 8760 hours.  HbA1C: No results for input(s): HGBA1C in the last 72 hours.  CBG: No results for input(s): GLUCAP in the last 168 hours.  Lipid Profile: No results for input(s): CHOL, HDL, LDLCALC, TRIG, CHOLHDL, LDLDIRECT in the last 72 hours.  Thyroid Function Tests: No results for input(s): TSH, T4TOTAL, FREET4, T3FREE, THYROIDAB in the last 72 hours.  Anemia Panel: No results for input(s): VITAMINB12, FOLATE, FERRITIN, TIBC, IRON, RETICCTPCT in the last 72 hours.  Urine analysis: No results found for: COLORURINE, APPEARANCEUR, LABSPEC, PHURINE,  GLUCOSEU, HGBUR, BILIRUBINUR, KETONESUR, PROTEINUR, UROBILINOGEN, NITRITE, LEUKOCYTESUR  Sepsis Labs: Lactic Acid, Venous    Component Value Date/Time   LATICACIDVEN 1.75 12/31/2016 2044    MICROBIOLOGY: Recent Results (from the past 240 hour(s))  Surgical pcr screen     Status: None   Collection Time: 01/01/17 12:24 PM  Result Value Ref Range Status   MRSA, PCR NEGATIVE NEGATIVE Final   Staphylococcus aureus NEGATIVE NEGATIVE Final    Comment:        The Xpert SA Assay (FDA approved for NASAL specimens in patients over 34 years of age), is one component of a comprehensive surveillance program.  Test performance has been validated by [facility] for patients greater than or equal to 75 year old. It is not intended to diagnose infection nor to guide or monitor treatment.   Aerobic/Anaerobic Culture (surgical/deep wound)     Status: None (Preliminary result)   Collection Time: 01/01/17  2:55 PM  Result Value Ref Range Status   Specimen Description ABSCESS LEFT THIGH  Final   Special Requests NONE  Final   Gram Stain   Final    ABUNDANT WBC PRESENT, PREDOMINANTLY PMN FEW GRAM POSITIVE COCCI IN PAIRS IN SINGLES    Culture   Final    FEW UNIDENTIFIED ORGANISM IDENTIFICATION AND SUSCEPTIBILITIES TO FOLLOW    Report Status PENDING  Incomplete    RADIOLOGY STUDIES/RESULTS: Mr Femur Left Wo Contrast  Result Date: 01/01/2017 CLINICAL DATA:  Patient fell at work on 12/14/2016. Two days ago, patient started noticing swelling, erythema and warmth of the leg. Fever to 103. EXAM: MR OF THE LEFT FEMUR WITHOUT CONTRAST TECHNIQUE: Multiplanar, multisequence MR imaging of the left femur/thigh was performed. No intravenous contrast was administered. COMPARISON:  None. FINDINGS: Bones/Joint/Cartilage The hip joints are maintained. No bone marrow edema, fracture or bone destruction. No focal chondral defect identified. Small effusions of the visualized left hip and both knees. Ligaments  Intact Muscles and Tendons No intramuscular hemorrhage, abscess or mass. Posttraumatic fluid outlines the biceps femoris. Soft tissues Cellulitis of the subcutaneous fat of the left thigh with soft tissue edema overlying the left vastus lateralis muscle. There are several simple and complex fluid collections identified overlying the left vastus lateralis, the more anterolateral is simple in appearance spanning 24.7 cm craniocaudad by 1.3 cm in thickness. Overlying this is a fluid-hematocrit containing fluid collection consistent with a hematoma measuring 6.3 x 3.9 x 1.3 cm at the level of the midthigh. Of concern however is a 5 x 5.6 x 1.1 cm collection which appears to contain an air-fluid level along the lateral aspect of the left midthigh concerning for an abscess. This is seen approximately 18.5 cm distal to the greater trochanter. IMPRESSION: 1. Air-fluid level within a subcutaneous left lateral mid thigh fluid collection measuring 5 x 5.6 x 1.1 cm concerning for soft tissue abscess in the setting of post traumatic soft tissue edema/cellulitis. 2. Adjacent slightly more proximal hematoma with fluid -hematocrit level measuring 6.3 x 3.9 x 1.3 cm also along the lateral aspect of the left mid thigh. 3. Posttraumatic simple fluid overlies the vastus lateralis and biceps femoris muscles. No underlying bone destruction nor fracture identified. Electronically Signed   By: Tollie Eth M.D.   On: 01/01/2017 03:31   Dg Knee Complete 4 Views Left  Result Date: 12/17/2016 CLINICAL DATA:  46 year old male fell 12/14/2016 twisting knee. Left knee pain. Initial encounter. EXAM: LEFT KNEE - COMPLETE 4+ VIEW COMPARISON:  None. FINDINGS: No fracture or dislocation. Patella slightly high-riding. Question radiopaque foreign body adjacent to fibular head on three views but not seen on lateral view and therefore or possibly artifact. IMPRESSION: No fracture or dislocation. Please see above. Electronically Signed   By: Lacy Duverney M.D.   On: 12/17/2016 11:11   Dg Femur Min 2 Views Left  Result Date: 12/31/2016 CLINICAL DATA:  stepped into a deep hole on 1/26 while at work. He states that he has swelling on the medial aspect of the knee and lateral aspect of the thigh. The pain is worse with bending of the knee and with weightbearing and feels cold at times. EXAM: LEFT FEMUR 2 VIEWS COMPARISON:  None. FINDINGS: There is no acute fracture or subluxation. Soft tissue edema is noted throughout the thigh, focally prominent in the lateral aspect of the midthigh. No radiopaque foreign body identified however. IMPRESSION: 1.  No evidence for acute fracture. 2. Soft tissue swelling.  Electronically Signed   By: Norva PavlovElizabeth  Brown M.D.   On: 12/31/2016 19:53     LOS: 3 days   [facility]ers[facility] Medical CenterGHIMIRE,Heidy Mccubbin [facility] Pager:[phone]  If 7PM-7AM, please contact night-coverage [email or web address] Password [email or web address] 01/03/2017, 3:08 PM

## 2017-01-03 NOTE — Clinical Social Work Note (Signed)
[facility] Pt to arrange f/u appt upon d/c at PCP. Listed on discharge orders. CSW signing off as no further needs identified. Reconsult, if needed.   Corlis HoveJeneya Gari Hartsell, Theresia MajorsLCSWA, [facility]A Clinical Social Worker  [phone]

## 2017-01-04 LAB — VANCOMYCIN, TROUGH: Vancomycin Tr: 12 ug/mL — ABNORMAL LOW (ref 15–20)

## 2017-01-04 NOTE — Progress Notes (Signed)
LOS: 4 days   Subjective: Wife not at bedside this am. Patient reports adequate pain control. Tolerating regular diet. Positive BM. Ambulated yesterday using a walker.   Denies abdominal pain, nausea, vomiting, extremity numbness   Objective: Vital signs in last 24 hours: Temp:  [98.3 F (36.8 C)-98.9 F (37.2 C)] 98.3 F (36.8 C) (02/16 0517) Pulse Rate:  [87-91] 90 (02/16 0517) Resp:  [16-18] 16 (02/16 0517) BP: (110-130)/(69-71) 110/69 (02/16 0517) SpO2:  [97 %-100 %] 100 % (02/16 0517) Last BM Date: 01/03/17   Laboratory  CBC  Recent Labs  01/02/17 0516  WBC 12.1*  HGB 11.5*  HCT 34.1*  PLT 241   BMET  Recent Labs  01/02/17 0516  NA 134*  K 3.9  CL 99*  CO2 26  GLUCOSE 129*  BUN 7  CREATININE 0.84  CALCIUM 8.3*     Physical Exam General appearance: alert, afebrile, no acute distress Head: normocephalic, atraumatic Resp: normal breathing effort, clear to auscultation Cardio: regular rate and rhythm, no murmur, rub, gallop appreciated GI: bowel sounds present, non tender, non distended Extremities: warm, no cyanosis, sensation intact, NPWD clean, dry, intact Pulses: 2+ and symmetric   Assessment/Plan: Fall Hematoma left thigh, infected Abscess left thigh - s/p bedside I&D 02/12, s/p OR I&D 02/12. NPWD placed yesterday.  Hypothyroidism - synthroid  FEN- tolerating regular diet ID-per pharmacy VTE-SCD's, lovenox  Plan: pain management, maybe home tomorrow with oral abx and home health  Gerald DexterLogan Eira Alpert, PA-Student General Trauma PA Pager: [phone]  01/04/2017

## 2017-01-04 NOTE — Progress Notes (Signed)
PROGRESS NOTE        PATIENT DETAILS Name: David Palmer Age: 46 y.o. Sex: male Date of Birth: 1971/04/10 Admit Date: 12/31/2016 Admitting Physician Eduard Clos, MD [facility]  Brief Narrative: Patient is a 46 y.o. male with history of hypothyroidism, admitted with cellulitis and abscess office left upper thigh. with left upper leg abscess and cellulitis. Seen by general surgery and underwent an incision and drainage on 2/13.   Subjective: He is doing well without much pain.   Assessment/Plan: Cellulitis with abscess of left thigh: Underwent incision and drainage on 2/13, continue wound care and Vancomycin. Prelim wound culture + for Staph/  Gen. surgery following and directing postop care- wound VAC placed on 2/15-per [facility] note-discharge home likely on 2/17  Hypothyroidism: Continue Synthroid.   DVT Prophylaxis: Prophylactic Lovenox  Code Status: Full code  Family Communication: Spouse at bedside  Disposition Plan: Remain inpatient-home when cleared by general surgery  Antimicrobial agents: Anti-infectives    Start     Dose/Rate Route Frequency Ordered Stop   01/01/17 1515  piperacillin-tazobactam (ZOSYN) IVPB 3.375 g  Status:  Discontinued     3.375 g 12.5 mL/hr over 240 Minutes Intravenous  Once 01/01/17 1505 01/01/17 1507   01/01/17 1515  piperacillin-tazobactam (ZOSYN) IVPB 3.375 g     3.375 g 100 mL/hr over 30 Minutes Intravenous  Once 01/01/17 1508 01/01/17 1719   01/01/17 1500  piperacillin-tazobactam (ZOSYN) IVPB 3.375 g  Status:  Discontinued     3.375 g 100 mL/hr over 30 Minutes Intravenous  Once 01/01/17 1457 01/01/17 1505   01/01/17 0800  vancomycin (VANCOCIN) IVPB 1000 mg/200 mL premix     1,000 mg 100 mL/hr over 120 Minutes Intravenous Every 12 hours 12/31/16 2236     01/01/17 0600  piperacillin-tazobactam (ZOSYN) IVPB 3.375 g  Status:  Discontinued     3.375 g 12.5 mL/hr over 240 Minutes  Intravenous Every 8 hours 12/31/16 2236 01/01/17 1132   12/31/16 2230  piperacillin-tazobactam (ZOSYN) IVPB 3.375 g     3.375 g 100 mL/hr over 30 Minutes Intravenous  Once 12/31/16 2218 12/31/16 2329   12/31/16 1945  vancomycin (VANCOCIN) IVPB 1000 mg/200 mL premix     1,000 mg 200 mL/hr over 60 Minutes Intravenous  Once 12/31/16 1933 12/31/16 2033      Procedures: None  CONSULTS:  general surgery  Time spent: 25-30 minutes-Greater than 50% of this time was spent in counseling, explanation of diagnosis, planning of further management, and coordination of care.  MEDICATIONS: Scheduled Meds: . diphenhydrAMINE  25 mg Intravenous Q12H   And  . vancomycin  1,000 mg Intravenous Q12H  . enoxaparin (LOVENOX) injection  40 mg Subcutaneous Q24H  . levothyroxine  125 mcg Oral QAC breakfast   Continuous Infusions:  PRN Meds:.acetaminophen **OR** acetaminophen, ondansetron **OR** ondansetron (ZOFRAN) IV, traMADol   PHYSICAL EXAM: Vital signs: Vitals:   01/03/17 0544 01/03/17 1500 01/03/17 2049 01/04/17 0517  BP: 106/64 130/70 126/71 110/69  Pulse: 72 91 87 90  Resp: 17 18 18 16   Temp: 98.4 F (36.9 C) 98.9 F (37.2 C) 98.4 F (36.9 C) 98.3 F (36.8 C)  TempSrc: Oral Oral Oral Oral  SpO2: 99% 97% 100% 100%  Weight:      Height:       Filed Weights   12/31/16 2225  Weight: 73.1  kg (161 lb 3.2 oz)   Body mass index is 27.67 kg/m.   General appearance :Awake, alert, not in any distress. Speech Clear. Not toxic Looking Eyes:, pupils equally reactive to light and accomodation,no scleral icterus.Pink conjunctiva HEENT: Atraumatic and Normocephalic Neck: supple, no JVD. No cervical lymphadenopathy. No thyromegaly Resp:Good air entry bilaterally, no added sounds  CVS: S1 S2 regular, no murmurs.  GI: Bowel sounds present, Non tender and not distended with no gaurding, rigidity or rebound.No organomegaly Extremities: Left upper outer thigh-wound vac in place Neurology:  speech  clear,Non focal, sensation is grossly intact. Psychiatric: Normal judgment and insight. Alert and oriented x 3. Normal mood. Musculoskeletal:No digital cyanosis Skin:No Rash, warm and dry Wounds:N/A  I have personally reviewed following labs and imaging studies  LABORATORY DATA: CBC:  Recent Labs Lab 12/31/16 1719 01/01/17 0631 01/02/17 0516  WBC 15.2* 13.1* 12.1*  NEUTROABS 13.8*  --   --   HGB 13.0 11.2* 11.5*  HCT 38.2* 33.0* 34.1*  MCV 85.7 85.7 85.7  PLT 234 208 241    Basic Metabolic Panel:  Recent Labs Lab 12/31/16 1719 01/01/17 0631 01/02/17 0516  NA 137 135 134*  K 3.7 3.2* 3.9  CL 103 103 99*  CO2 25 25 26   GLUCOSE 112* 128* 129*  BUN 9 7 7   CREATININE 1.03 0.85 0.84  CALCIUM 9.0 8.0* 8.3*    GFR: Estimated Creatinine Clearance: 98.6 mL/min (by C-G formula based on SCr of 0.84 mg/dL).  Liver Function Tests:  Recent Labs Lab 12/31/16 1719  AST 26  ALT 26  ALKPHOS 51  BILITOT 1.3*  PROT 7.2  ALBUMIN 4.0   No results for input(s): LIPASE, AMYLASE in the last 168 hours. No results for input(s): AMMONIA in the last 168 hours.  Coagulation Profile: No results for input(s): INR, PROTIME in the last 168 hours.  Cardiac Enzymes: No results for input(s): CKTOTAL, CKMB, CKMBINDEX, TROPONINI in the last 168 hours.  BNP (last 3 results) No results for input(s): PROBNP in the last 8760 hours.  HbA1C: No results for input(s): HGBA1C in the last 72 hours.  CBG: No results for input(s): GLUCAP in the last 168 hours.  Lipid Profile: No results for input(s): CHOL, HDL, LDLCALC, TRIG, CHOLHDL, LDLDIRECT in the last 72 hours.  Thyroid Function Tests: No results for input(s): TSH, T4TOTAL, FREET4, T3FREE, THYROIDAB in the last 72 hours.  Anemia Panel: No results for input(s): VITAMINB12, FOLATE, FERRITIN, TIBC, IRON, RETICCTPCT in the last 72 hours.  Urine analysis: No results found for: COLORURINE, APPEARANCEUR, LABSPEC, PHURINE, GLUCOSEU,  HGBUR, BILIRUBINUR, KETONESUR, PROTEINUR, UROBILINOGEN, NITRITE, LEUKOCYTESUR  Sepsis Labs: Lactic Acid, Venous    Component Value Date/Time   LATICACIDVEN 1.75 12/31/2016 2044    MICROBIOLOGY: Recent Results (from the past 240 hour(s))  Surgical pcr screen     Status: None   Collection Time: 01/01/17 12:24 PM  Result Value Ref Range Status   MRSA, PCR NEGATIVE NEGATIVE Final   Staphylococcus aureus NEGATIVE NEGATIVE Final    Comment:        The Xpert SA Assay (FDA approved for NASAL specimens in patients over 31 years of age), is one component of a comprehensive surveillance program.  Test performance has been validated by [facility] for patients greater than or equal to 36 year old. It is not intended to diagnose infection nor to guide or monitor treatment.   Aerobic/Anaerobic Culture (surgical/deep wound)     Status: None (Preliminary result)   Collection Time: 01/01/17  2:55 PM  Result Value Ref Range Status   Specimen Description ABSCESS LEFT THIGH  Final   Special Requests NONE  Final   Gram Stain   Final    ABUNDANT WBC PRESENT, PREDOMINANTLY PMN FEW GRAM POSITIVE COCCI IN PAIRS IN SINGLES    Culture   Final    FEW STAPHYLOCOCCUS AUREUS SUSCEPTIBILITIES TO FOLLOW    Report Status PENDING  Incomplete    RADIOLOGY STUDIES/RESULTS: Mr Femur Left Wo Contrast  Result Date: 01/01/2017 CLINICAL DATA:  Patient fell at work on 12/14/2016. Two days ago, patient started noticing swelling, erythema and warmth of the leg. Fever to 103. EXAM: MR OF THE LEFT FEMUR WITHOUT CONTRAST TECHNIQUE: Multiplanar, multisequence MR imaging of the left femur/thigh was performed. No intravenous contrast was administered. COMPARISON:  None. FINDINGS: Bones/Joint/Cartilage The hip joints are maintained. No bone marrow edema, fracture or bone destruction. No focal chondral defect identified. Small effusions of the visualized left hip and both knees. Ligaments Intact Muscles and Tendons No  intramuscular hemorrhage, abscess or mass. Posttraumatic fluid outlines the biceps femoris. Soft tissues Cellulitis of the subcutaneous fat of the left thigh with soft tissue edema overlying the left vastus lateralis muscle. There are several simple and complex fluid collections identified overlying the left vastus lateralis, the more anterolateral is simple in appearance spanning 24.7 cm craniocaudad by 1.3 cm in thickness. Overlying this is a fluid-hematocrit containing fluid collection consistent with a hematoma measuring 6.3 x 3.9 x 1.3 cm at the level of the midthigh. Of concern however is a 5 x 5.6 x 1.1 cm collection which appears to contain an air-fluid level along the lateral aspect of the left midthigh concerning for an abscess. This is seen approximately 18.5 cm distal to the greater trochanter. IMPRESSION: 1. Air-fluid level within a subcutaneous left lateral mid thigh fluid collection measuring 5 x 5.6 x 1.1 cm concerning for soft tissue abscess in the setting of post traumatic soft tissue edema/cellulitis. 2. Adjacent slightly more proximal hematoma with fluid -hematocrit level measuring 6.3 x 3.9 x 1.3 cm also along the lateral aspect of the left mid thigh. 3. Posttraumatic simple fluid overlies the vastus lateralis and biceps femoris muscles. No underlying bone destruction nor fracture identified. Electronically Signed   By: Tollie Eth M.D.   On: 01/01/2017 03:31   Dg Knee Complete 4 Views Left  Result Date: 12/17/2016 CLINICAL DATA:  46 year old male fell 12/14/2016 twisting knee. Left knee pain. Initial encounter. EXAM: LEFT KNEE - COMPLETE 4+ VIEW COMPARISON:  None. FINDINGS: No fracture or dislocation. Patella slightly high-riding. Question radiopaque foreign body adjacent to fibular head on three views but not seen on lateral view and therefore or possibly artifact. IMPRESSION: No fracture or dislocation. Please see above. Electronically Signed   By: Lacy Duverney M.D.   On: 12/17/2016  11:11   Dg Femur Min 2 Views Left  Result Date: 12/31/2016 CLINICAL DATA:  stepped into a deep hole on 1/26 while at work. He states that he has swelling on the medial aspect of the knee and lateral aspect of the thigh. The pain is worse with bending of the knee and with weightbearing and feels cold at times. EXAM: LEFT FEMUR 2 VIEWS COMPARISON:  None. FINDINGS: There is no acute fracture or subluxation. Soft tissue edema is noted throughout the thigh, focally prominent in the lateral aspect of the midthigh. No radiopaque foreign body identified however. IMPRESSION: 1.  No evidence for acute fracture. 2. Soft tissue swelling. Electronically Signed  By: Norva PavlovElizabeth  Brown M.D.   On: 12/31/2016 19:53     LOS: 4 days   [facility]ows HospitalGHIMIRE,Nuala Chiles [facility] Pager:[phone]  If 7PM-7AM, please contact night-coverage [email or web address] Password [email or web address] 01/04/2017, 8:04 AM

## 2017-01-04 NOTE — Discharge Instructions (Signed)
Home health agency is [facility]   [phone]

## 2017-01-04 NOTE — Care Management (Addendum)
SeychellesKenya Hawkins with [employer] [phone] ,arranged home health with [facility] start date Tuesday 01-08-17 .   Noted in PA student note patient using walker . Paged Marisue IvanLiz she will enter order for walker . NCM will fax order to Ms Juanetta GoslingHawkins once received [phone] . Ms Juanetta GoslingHawkins calling patient's daughter to see if she wants walker delivered to hospital or to home.        [employer] V[facility] will be delivered to hospital room today. Workers Comp still working on Oceanographerarranging home health.   Order for walker faxed to Ms Juanetta Goslinghawkins.   Called patient's daughter explained all of above , she voiced understanding. Daughter has Kerin RansomKanya Hawkins contact information also.   Ronny FlurryHeather Jessicamarie Amiri RN BSN [phone]

## 2017-01-04 NOTE — Progress Notes (Signed)
Pharmacy Antibiotic Note  David Palmer is a 46 y.o. male admitted on 12/31/2016 with L-thigh cellulitis. Pharmacy has been consulted for Vancomycin and Zosyn dosing.   The patient had a "redman reaction" to Vancomycin in the ED so will pre-medicate with benadryl and infuse more slowly to prevent this from occurring.   Today is day #5 of vancomycin and zosyn. Vancomycin trough is 12- at goal of 10 to 15 mcg/mL for cellulitis.    Plan: Continue Vancomycin 1g IV every 12 hours Pre-medicate with benadryl 25 mg IV 30 minutes prior to each Vancomycin dose Continue Zosyn 3.375g IV every 8 hours Monitor renal fx, cultures  Height: 5\' 4"  (162.6 cm) Weight: 161 lb 3.2 oz (73.1 kg) IBW/kg (Calculated) : 59.2  Temp (24hrs), Avg:98.5 F (36.9 C), Min:98.3 F (36.8 C), Max:98.9 F (37.2 C)   Recent Labs Lab 12/31/16 1719 12/31/16 1741 12/31/16 2044 01/01/17 0631 01/02/17 0516 01/04/17 0742  WBC 15.2*  --   --  13.1* 12.1*  --   CREATININE 1.03  --   --  0.85 0.84  --   LATICACIDVEN  --  0.94 1.75  --   --   --   VANCOTROUGH  --   --   --   --   --  12*    Estimated Creatinine Clearance: 98.6 mL/min (by C-G formula based on SCr of 0.84 mg/dL).    Allergies  Allergen Reactions  . Vancomycin Other (See Comments)    Redman infusion    Antimicrobials this admission: Vanc 2/12 >> Zosyn 2/12 >>  Dose adjustments this admission: 2/16: Vancomycin trough 12 - at goal on 1g IV q12. No change.   Microbiology results: 2/13 L thigh abscess: GPC on gram stain 2/13 mrsa pcr: neg   Thank you for allowing pharmacy to be a part of this patient's care.  [name], PharmD, BCPS Clinical Pharmacist Clinical Phone 01/04/2017 until 3:30 PM - [phone] After hours, please call #[phone] 01/04/2017 9:20 AM

## 2017-01-05 MED ORDER — DOXYCYCLINE HYCLATE 100 MG PO TABS: 100.0000 mg | ORAL_TABLET | Freq: Two times a day (BID) | ORAL | 0 refills | Status: AC

## 2017-01-05 MED ORDER — TRAMADOL HCL 50 MG PO TABS: 50.0000 mg | ORAL_TABLET | Freq: Four times a day (QID) | ORAL | 0 refills | Status: AC | PRN

## 2017-01-05 NOTE — Progress Notes (Addendum)
4 Days Post-Op  Subjective: Spoke with patient and 2 family members for translation He is ready to go home Negative pressure dressing left side intact Home health nursing arranged Follow-up in [facility] trauma clinic on favor 22nd.   Objective: Vital signs in last 24 hours: Temp:  [98.4 F (36.9 C)-98.9 F (37.2 C)] 98.7 F (37.1 C) (02/17 0440) Pulse Rate:  [87-91] 91 (02/17 0440) Resp:  [17] 17 (02/17 0440) BP: (112-129)/(66-70) 119/66 (02/17 0440) SpO2:  [98 %-100 %] 100 % (02/17 0440) Last BM Date: 01/03/17  Intake/Output from previous day: 02/16 0701 - 02/17 0700 In: 1140 [P.O.:1140] Out: 3625 [Urine:3625] Intake/Output this shift: Total I/O In: 240 [P.O.:240] Out: 300 [Urine:300]    General appearance: alert, afebrile, no acute distress.  2 family members present for discussion Resp: normal breathing effort, clear to auscultation Cardio: regular rate and rhythm, no murmur, rub, gallop appreciated GI: bowel sounds present, non tender, non distended Extremities: warm, no cyanosis, sensation intact, NPWD clean, dry, intact Pulses: 2+ and symmetric   Lab Results:  No results found for this or any previous visit (from the past 24 hour(s)).   Studies/Results: No results found.  . diphenhydrAMINE  25 mg Intravenous Q12H   And  . vancomycin  1,000 mg Intravenous Q12H  . enoxaparin (LOVENOX) injection  40 mg Subcutaneous Q24H  . levothyroxine  125 mcg Oral QAC breakfast     Assessment/Plan: s/p Procedure(s): INCISION AND DRAINAGE Left thigh ABSCESS  Fall Hematoma left thigh, infected Abscess left thigh - s/p bedside I&D 02/12, s/p OR I&D 02/12. NPWD placed 2/15.  Hypothyroidism - synthroid  FEN- tolerating regular diet ID-per pharmacy VTE-SCD's, lovenox  Plan: pain management, home today with oral abx and home health            Since cultures grew staph aureus, recommend either Bactrim or doxycycline for another 7 days.   @PROBHOSP @  LOS: 5 days     Tamir Wallman M 01/05/2017  . .prob

## 2017-01-05 NOTE — Discharge Summary (Signed)
PATIENT DETAILS Name: David Palmer Age: 46 y.o. Sex: male Date of Birth: November 27, 1970 MRN: [id number]. Admitting Physician: Eduard Clos, MD [facility]  Admit Date: 12/31/2016 Discharge date: 01/05/2017  Recommendations for Outpatient Follow-up:  1. Follow up with PCP in 1-2 weeks 2. Please obtain BMP/CBC in one week  Admitted From:  Home   Disposition: Home with home health services   Home Health: Yes  Equipment/Devices: None  Discharge Condition: Stable  CODE STATUS: FULL CODE  Diet recommendation:  Regular   Brief Summary: See H&P, Labs, Consult and Test reports for all details in brief, Patient is a 46 y.o. male with history of hypothyroidism, admitted with cellulitis and abscess office left upper thigh. with left upper leg abscess and cellulitis. Seen by general surgery and underwent an incision and drainage on 2/13.   Brief Hospital Course: Cellulitis with abscess of left thigh: Underwent incision and drainage on 2/13,managed with IV Vancomycin. Wound culture + for MSSA. Gen Surgery has applied a wound vac-which will be continued on discharge. HHRN has been ordered, will transition to oral Doxycycline. Gen surgery will follow up at the trauma clinic on 2/22  Hypothyroidism: Continue Synthroid.   Procedures/Studies: incision and drainage on 2/13  Discharge Diagnoses:  Principal Problem:   Cellulitis of left thigh Active Problems:   Cellulitis   Hypothyroidism   Discharge Instructions:  Activity:  As tolerated with Full fall precautions use walker/cane & assistance as needed   Discharge Instructions    Call MD for:  redness, tenderness, or signs of infection (pain, swelling, redness, odor or green/yellow discharge around incision site)    Complete by:  As directed    Diet general    Complete by:  As directed    Discharge instructions    Complete by:  As directed    Follow with Primary MD  [facility] in 1 week  Follow with Gen Surgery as instructed  Please get a complete blood count and chemistry panel checked by your Primary MD at your next visit, and again as instructed by your Primary MD.  Get Medicines reviewed and adjusted: Please take all your medications with you for your next visit with your Primary MD  Laboratory/radiological data: Please request your Primary MD to go over all hospital tests and procedure/radiological results at the follow up, please ask your Primary MD to get all Hospital records sent to his/her office.  In some cases, they will be blood work, cultures and biopsy results pending at the time of your discharge. Please request that your primary care M.D. follows up on these results.  Also Note the following: If you experience worsening of your admission symptoms, develop shortness of breath, life threatening emergency, suicidal or homicidal thoughts you must seek medical attention immediately by calling 911 or calling your MD immediately  if symptoms less severe.  You must read complete instructions/literature along with all the possible adverse reactions/side effects for all the Medicines you take and that have been prescribed to you. Take any new Medicines after you have completely understood and accpet all the possible adverse reactions/side effects.   Do not drive when taking Pain medications or sleeping medications (Benzodaizepines)  Do not take more than prescribed Pain, Sleep and Anxiety Medications. It is not advisable to combine anxiety,sleep and pain medications without talking with your primary care practitioner  Special Instructions: If you have smoked or chewed Tobacco  in the last 2 yrs please stop smoking,  stop any regular Alcohol  and or any Recreational drug use.  Wear Seat belts while driving.  Please note: You were cared for by a hospitalist during your hospital stay. Once you are discharged, your primary care physician will handle any  further medical issues. Please note that NO REFILLS for any discharge medications will be authorized once you are discharged, as it is imperative that you return to your primary care physician (or establish a relationship with a primary care physician if you do not have one) for your post hospital discharge needs so that they can reassess your need for medications and monitor your lab values.   Increase activity slowly    Complete by:  As directed      Allergies as of 01/05/2017      Reactions   Vancomycin Other (See Comments)   Arletha Grippe infusion      Medication List    TAKE these medications   doxycycline 100 MG tablet Commonly known as:  VIBRA-TABS Take 1 tablet (100 mg total) by mouth 2 (two) times daily.   ibuprofen 800 MG tablet Commonly known as:  ADVIL,MOTRIN Take 800 mg by mouth 3 (three) times daily as needed (for pain).   levothyroxine 125 MCG tablet Commonly known as:  SYNTHROID, LEVOTHROID Take 125 mcg by mouth daily before breakfast.   traMADol 50 MG tablet Commonly known as:  ULTRAM Take 1 tablet (50 mg total) by mouth every 6 (six) hours as needed (for pain). What changed:  how much to take  when to take this            Durable Medical Equipment        Start     Ordered   01/04/17 1348  For home use only DME Walker rolling  Once    Comments:  To help patient transfer and ambulate.  Physical / Occupational Therapy may change type of walker PRN.  Question:  Patient needs a walker to treat with the following condition  Answer:  Aftercare following surgery of the skin or subcutaneous tissue   01/04/17 1348     Follow-up Information    [facility]. Go on 01/10/2017.   Why:  Your appointment is 01/10/17 at 10:45AM. Please arrive 30 minutes early to check in and fill out necessary paperwork. Contact information: [address] [place] [address] [phone]       [facility]. Schedule an  appointment as soon as possible for a visit.   Contact information: [facility] [address] [place] [address] [phone]          Allergies  Allergen Reactions  . Vancomycin Other (See Comments)    Arletha Grippe infusion    Consultations:   general surgery   Other Procedures/Studies: Mr Femur Left Wo Contrast  Result Date: 01/01/2017 CLINICAL DATA:  Patient fell at work on 12/14/2016. Two days ago, patient started noticing swelling, erythema and warmth of the leg. Fever to 103. EXAM: MR OF THE LEFT FEMUR WITHOUT CONTRAST TECHNIQUE: Multiplanar, multisequence MR imaging of the left femur/thigh was performed. No intravenous contrast was administered. COMPARISON:  None. FINDINGS: Bones/Joint/Cartilage The hip joints are maintained. No bone marrow edema, fracture or bone destruction. No focal chondral defect identified. Small effusions of the visualized left hip and both knees. Ligaments Intact Muscles and Tendons No intramuscular hemorrhage, abscess or mass. Posttraumatic fluid outlines the biceps femoris. Soft tissues Cellulitis of the subcutaneous fat of the left thigh with soft  tissue edema overlying the left vastus lateralis muscle. There are several simple and complex fluid collections identified overlying the left vastus lateralis, the more anterolateral is simple in appearance spanning 24.7 cm craniocaudad by 1.3 cm in thickness. Overlying this is a fluid-hematocrit containing fluid collection consistent with a hematoma measuring 6.3 x 3.9 x 1.3 cm at the level of the midthigh. Of concern however is a 5 x 5.6 x 1.1 cm collection which appears to contain an air-fluid level along the lateral aspect of the left midthigh concerning for an abscess. This is seen approximately 18.5 cm distal to the greater trochanter. IMPRESSION: 1. Air-fluid level within a subcutaneous left lateral mid thigh fluid collection measuring 5 x 5.6 x 1.1 cm concerning for soft tissue abscess in the setting of post  traumatic soft tissue edema/cellulitis. 2. Adjacent slightly more proximal hematoma with fluid -hematocrit level measuring 6.3 x 3.9 x 1.3 cm also along the lateral aspect of the left mid thigh. 3. Posttraumatic simple fluid overlies the vastus lateralis and biceps femoris muscles. No underlying bone destruction nor fracture identified. Electronically Signed   By: Tollie Eth M.D.   On: 01/01/2017 03:31   Dg Knee Complete 4 Views Left  Result Date: 12/17/2016 CLINICAL DATA:  46 year old male fell 12/14/2016 twisting knee. Left knee pain. Initial encounter. EXAM: LEFT KNEE - COMPLETE 4+ VIEW COMPARISON:  None. FINDINGS: No fracture or dislocation. Patella slightly high-riding. Question radiopaque foreign body adjacent to fibular head on three views but not seen on lateral view and therefore or possibly artifact. IMPRESSION: No fracture or dislocation. Please see above. Electronically Signed   By: Lacy Duverney M.D.   On: 12/17/2016 11:11   Dg Femur Min 2 Views Left  Result Date: 12/31/2016 CLINICAL DATA:  stepped into a deep hole on 1/26 while at work. He states that he has swelling on the medial aspect of the knee and lateral aspect of the thigh. The pain is worse with bending of the knee and with weightbearing and feels cold at times. EXAM: LEFT FEMUR 2 VIEWS COMPARISON:  None. FINDINGS: There is no acute fracture or subluxation. Soft tissue edema is noted throughout the thigh, focally prominent in the lateral aspect of the midthigh. No radiopaque foreign body identified however. IMPRESSION: 1.  No evidence for acute fracture. 2. Soft tissue swelling. Electronically Signed   By: Norva Pavlov M.D.   On: 12/31/2016 19:53      TODAY-DAY OF DISCHARGE:  Subjective:   Derelle Cockrell today has no headache,no chest abdominal pain,no new weakness tingling or numbness, feels much better wants to go home today.   Objective:   Blood pressure 119/66, pulse 91, temperature 98.7 F (37.1 C),  temperature source Oral, resp. rate 17, height 5\' 4"  (1.626 m), weight 73.1 kg (161 lb 3.2 oz), SpO2 100 %.  Intake/Output Summary (Last 24 hours) at 01/05/17 0945 Last data filed at 01/05/17 0900  Gross per 24 hour  Intake             1140 ml  Output             3225 ml  Net            -2085 ml   Filed Weights   12/31/16 2225  Weight: 73.1 kg (161 lb 3.2 oz)    Exam: Awake Alert, Oriented *3, No new F.N deficits, Normal affect Belle.AT,PERRAL Supple Neck,No JVD, No cervical lymphadenopathy appriciated.  Symmetrical Chest wall movement, Good air movement bilaterally,  CTAB RRR,No Gallops,Rubs or new Murmurs, No Parasternal Heave +ve B.Sounds, Abd Soft, Non tender, No organomegaly appriciated, No rebound -guarding or rigidity. No Cyanosis, Clubbing or edema, No new Rash or bruise   PERTINENT RADIOLOGIC STUDIES: Mr Femur Left Wo Contrast  Result Date: 01/01/2017 CLINICAL DATA:  Patient fell at work on 12/14/2016. Two days ago, patient started noticing swelling, erythema and warmth of the leg. Fever to 103. EXAM: MR OF THE LEFT FEMUR WITHOUT CONTRAST TECHNIQUE: Multiplanar, multisequence MR imaging of the left femur/thigh was performed. No intravenous contrast was administered. COMPARISON:  None. FINDINGS: Bones/Joint/Cartilage The hip joints are maintained. No bone marrow edema, fracture or bone destruction. No focal chondral defect identified. Small effusions of the visualized left hip and both knees. Ligaments Intact Muscles and Tendons No intramuscular hemorrhage, abscess or mass. Posttraumatic fluid outlines the biceps femoris. Soft tissues Cellulitis of the subcutaneous fat of the left thigh with soft tissue edema overlying the left vastus lateralis muscle. There are several simple and complex fluid collections identified overlying the left vastus lateralis, the more anterolateral is simple in appearance spanning 24.7 cm craniocaudad by 1.3 cm in thickness. Overlying this is a  fluid-hematocrit containing fluid collection consistent with a hematoma measuring 6.3 x 3.9 x 1.3 cm at the level of the midthigh. Of concern however is a 5 x 5.6 x 1.1 cm collection which appears to contain an air-fluid level along the lateral aspect of the left midthigh concerning for an abscess. This is seen approximately 18.5 cm distal to the greater trochanter. IMPRESSION: 1. Air-fluid level within a subcutaneous left lateral mid thigh fluid collection measuring 5 x 5.6 x 1.1 cm concerning for soft tissue abscess in the setting of post traumatic soft tissue edema/cellulitis. 2. Adjacent slightly more proximal hematoma with fluid -hematocrit level measuring 6.3 x 3.9 x 1.3 cm also along the lateral aspect of the left mid thigh. 3. Posttraumatic simple fluid overlies the vastus lateralis and biceps femoris muscles. No underlying bone destruction nor fracture identified. Electronically Signed   By: Tollie Eth M.D.   On: 01/01/2017 03:31   Dg Knee Complete 4 Views Left  Result Date: 12/17/2016 CLINICAL DATA:  46 year old male fell 12/14/2016 twisting knee. Left knee pain. Initial encounter. EXAM: LEFT KNEE - COMPLETE 4+ VIEW COMPARISON:  None. FINDINGS: No fracture or dislocation. Patella slightly high-riding. Question radiopaque foreign body adjacent to fibular head on three views but not seen on lateral view and therefore or possibly artifact. IMPRESSION: No fracture or dislocation. Please see above. Electronically Signed   By: Lacy Duverney M.D.   On: 12/17/2016 11:11   Dg Femur Min 2 Views Left  Result Date: 12/31/2016 CLINICAL DATA:  stepped into a deep hole on 1/26 while at work. He states that he has swelling on the medial aspect of the knee and lateral aspect of the thigh. The pain is worse with bending of the knee and with weightbearing and feels cold at times. EXAM: LEFT FEMUR 2 VIEWS COMPARISON:  None. FINDINGS: There is no acute fracture or subluxation. Soft tissue edema is noted throughout the  thigh, focally prominent in the lateral aspect of the midthigh. No radiopaque foreign body identified however. IMPRESSION: 1.  No evidence for acute fracture. 2. Soft tissue swelling. Electronically Signed   By: Norva Pavlov M.D.   On: 12/31/2016 19:53     PERTINENT LAB RESULTS: CBC: No results for input(s): WBC, HGB, HCT, PLT in the last 72 hours. CMET CMP  Component Value Date/Time   NA 134 (L) 01/02/2017 0516   K 3.9 01/02/2017 0516   CL 99 (L) 01/02/2017 0516   CO2 26 01/02/2017 0516   GLUCOSE 129 (H) 01/02/2017 0516   BUN 7 01/02/2017 0516   CREATININE 0.84 01/02/2017 0516   CALCIUM 8.3 (L) 01/02/2017 0516   PROT 7.2 12/31/2016 1719   ALBUMIN 4.0 12/31/2016 1719   AST 26 12/31/2016 1719   ALT 26 12/31/2016 1719   ALKPHOS 51 12/31/2016 1719   BILITOT 1.3 (H) 12/31/2016 1719   GFRNONAA >60 01/02/2017 0516   GFRAA >60 01/02/2017 0516    GFR Estimated Creatinine Clearance: 98.6 mL/min (by C-G formula based on SCr of 0.84 mg/dL). No results for input(s): LIPASE, AMYLASE in the last 72 hours. No results for input(s): CKTOTAL, CKMB, CKMBINDEX, TROPONINI in the last 72 hours. Invalid input(s): POCBNP No results for input(s): DDIMER in the last 72 hours. No results for input(s): HGBA1C in the last 72 hours. No results for input(s): CHOL, HDL, LDLCALC, TRIG, CHOLHDL, LDLDIRECT in the last 72 hours. No results for input(s): TSH, T4TOTAL, T3FREE, THYROIDAB in the last 72 hours.  Invalid input(s): FREET3 No results for input(s): VITAMINB12, FOLATE, FERRITIN, TIBC, IRON, RETICCTPCT in the last 72 hours. Coags: No results for input(s): INR in the last 72 hours.  Invalid input(s): PT Microbiology: Recent Results (from the past 240 hour(s))  Surgical pcr screen     Status: None   Collection Time: 01/01/17 12:24 PM  Result Value Ref Range Status   MRSA, PCR NEGATIVE NEGATIVE Final   Staphylococcus aureus NEGATIVE NEGATIVE Final    Comment:        The Xpert SA Assay  (FDA approved for NASAL specimens in patients over 48 years of age), is one component of a comprehensive surveillance program.  Test performance has been validated by [facility] for patients greater than or equal to 48 year old. It is not intended to diagnose infection nor to guide or monitor treatment.   Aerobic/Anaerobic Culture (surgical/deep wound)     Status: None (Preliminary result)   Collection Time: 01/01/17  2:55 PM  Result Value Ref Range Status   Specimen Description ABSCESS LEFT THIGH  Final   Special Requests NONE  Final   Gram Stain   Final    ABUNDANT WBC PRESENT, PREDOMINANTLY PMN FEW GRAM POSITIVE COCCI IN PAIRS IN SINGLES    Culture   Final    FEW STAPHYLOCOCCUS AUREUS NO ANAEROBES ISOLATED; CULTURE IN PROGRESS FOR 5 DAYS    Report Status PENDING  Incomplete   Organism ID, Bacteria STAPHYLOCOCCUS AUREUS  Final      Susceptibility   Staphylococcus aureus - MIC*    CIPROFLOXACIN <=0.5 SENSITIVE Sensitive     ERYTHROMYCIN <=0.25 SENSITIVE Sensitive     GENTAMICIN <=0.5 SENSITIVE Sensitive     OXACILLIN 0.5 SENSITIVE Sensitive     TETRACYCLINE <=1 SENSITIVE Sensitive     VANCOMYCIN <=0.5 SENSITIVE Sensitive     TRIMETH/SULFA <=10 SENSITIVE Sensitive     CLINDAMYCIN <=0.25 SENSITIVE Sensitive     RIFAMPIN <=0.5 SENSITIVE Sensitive     Inducible Clindamycin NEGATIVE Sensitive     * FEW STAPHYLOCOCCUS AUREUS    FURTHER DISCHARGE INSTRUCTIONS:  Get Medicines reviewed and adjusted: Please take all your medications with you for your next visit with your Primary MD  Laboratory/radiological data: Please request your Primary MD to go over all hospital tests and procedure/radiological results at the follow up, please ask your  Primary MD to get all Hospital records sent to his/her office.  In some cases, they will be blood work, cultures and biopsy results pending at the time of your discharge. Please request that your primary care M.D. goes through all the  records of your hospital data and follows up on these results.  Also Note the following: If you experience worsening of your admission symptoms, develop shortness of breath, life threatening emergency, suicidal or homicidal thoughts you must seek medical attention immediately by calling 911 or calling your MD immediately  if symptoms less severe.  You must read complete instructions/literature along with all the possible adverse reactions/side effects for all the Medicines you take and that have been prescribed to you. Take any new Medicines after you have completely understood and accpet all the possible adverse reactions/side effects.   Do not drive when taking Pain medications or sleeping medications (Benzodaizepines)  Do not take more than prescribed Pain, Sleep and Anxiety Medications. It is not advisable to combine anxiety,sleep and pain medications without talking with your primary care practitioner  Special Instructions: If you have smoked or chewed Tobacco  in the last 2 yrs please stop smoking, stop any regular Alcohol  and or any Recreational drug use.  Wear Seat belts while driving.  Please note: You were cared for by a hospitalist during your hospital stay. Once you are discharged, your primary care physician will handle any further medical issues. Please note that NO REFILLS for any discharge medications will be authorized once you are discharged, as it is imperative that you return to your primary care physician (or establish a relationship with a primary care physician if you do not have one) for your post hospital discharge needs so that they can reassess your need for medications and monitor your lab values.  Total Time spent coordinating discharge including counseling, education and face to face time equals  45 minutes.  SignedJeoffrey Massed 01/05/2017 9:45 AM

## 2017-01-06 LAB — AEROBIC/ANAEROBIC CULTURE (SURGICAL/DEEP WOUND)

## 2017-01-06 LAB — AEROBIC/ANAEROBIC CULTURE W GRAM STAIN (SURGICAL/DEEP WOUND)

## 2017-01-17 ENCOUNTER — Ambulatory Visit (INDEPENDENT_AMBULATORY_CARE_PROVIDER_SITE_OTHER): Payer: Self-pay | Admitting: Orthopaedic Surgery

## 2017-01-21 ENCOUNTER — Telehealth (INDEPENDENT_AMBULATORY_CARE_PROVIDER_SITE_OTHER): Payer: Self-pay | Admitting: *Deleted

## 2017-01-21 NOTE — Telephone Encounter (Signed)
appt on 01/17/17 was cancelled. Next appt is 02/05/17. No phone # documented to be able to inform her.

## 2017-01-21 NOTE — Telephone Encounter (Signed)
Sammie Benchina Greene the case manager for this patient's workman's comp claim called this afternoon. She would like us to fax her Der. Xu's last office notes please her Fax # [phone]. Thank you.

## 2017-02-05 ENCOUNTER — Ambulatory Visit (INDEPENDENT_AMBULATORY_CARE_PROVIDER_SITE_OTHER): Payer: Worker's Compensation | Admitting: Orthopaedic Surgery

## 2017-02-05 ENCOUNTER — Encounter (INDEPENDENT_AMBULATORY_CARE_PROVIDER_SITE_OTHER): Payer: Self-pay | Admitting: Orthopaedic Surgery

## 2017-02-05 ENCOUNTER — Telehealth (INDEPENDENT_AMBULATORY_CARE_PROVIDER_SITE_OTHER): Payer: Self-pay

## 2017-02-05 DIAGNOSIS — M25562 Pain in left knee: Secondary | ICD-10-CM

## 2017-02-05 NOTE — Telephone Encounter (Signed)
FAXED NOTE TO TINA GREEN TO   [phone]

## 2017-02-05 NOTE — Progress Notes (Signed)
Office Visit Note   Patient: David Palmer           Date of Birth: 1971/08/20           MRN: [id number] Visit Date: 02/05/2017              [facility] [address] [place], [place] [address] PCP: Keturah Barre [facility]   Assessment & Plan: Visit Diagnoses:  1. Acute pain of left knee     Plan: I recommend continued rest ice and elevation and compression. NSAIDs as needed. 5 impression is that he has a sprain versus contusion of his knee. I do not find any worrisome features on exam. We can consider a cortisone injection in his left knee once the wound has fully healed if he continues to have pain. Otherwise follow-up with me as needed. Total face to face encounter time was greater than 25 minutes and over half of this time was spent in counseling and/or coordination of care.  Follow-Up Instructions: Return if symptoms worsen or fail to improve.   Orders:  No orders of the defined types were placed in this encounter.  No orders of the defined types were placed in this encounter.     Procedures: No procedures performed   Clinical Data: No additional findings.   Subjective: Chief Complaint  Patient presents with  . Left Knee - Pain, Follow-up    Patient comes in today with his case manager and interpreter for follow-up of his left knee pain. He did have incision and drainage of infected left thigh hematoma by Dr. Doylene Canard. He is doing local better with his left knee. He still has pain and takes ibuprofen 3 times a day for this. Denies any constitutional symptoms    Review of Systems  Constitutional: Negative.   All other systems reviewed and are negative.    Objective: Vital Signs: There were no vitals taken for this visit.  Physical Exam  Constitutional: He is oriented to person, place, and time. He appears well-developed and well-nourished.  HENT:  Head: Normocephalic and atraumatic.  Eyes: Pupils are equal,  round, and reactive to light.  Neck: Neck supple.  Pulmonary/Chest: Effort normal.  Abdominal: Soft.  Musculoskeletal: Normal range of motion.  Neurological: He is alert and oriented to person, place, and time.  Skin: Skin is warm.  Psychiatric: He has a normal mood and affect. His behavior is normal. Judgment and thought content normal.  Nursing note and vitals reviewed.   Ortho Exam Left knee exam shows no joint effusion. He has good range of motion. Collaterals and cruciate's are stable. He is tender over the proximal medial portion of the tibia. Mild joint line tenderness. Also describes some pain over the superolateral corner of the patella. Specialty Comments:  No specialty comments available.  Imaging: No results found.   PMFS History: Patient Active Problem List   Diagnosis Date Noted  . Cellulitis 12/31/2016  . Cellulitis of left thigh 12/31/2016  . Hypothyroidism 12/31/2016  . Acute pain of left knee 12/27/2016   Past Medical History:  Diagnosis Date  . Hypothyroidism   . Thyroid disease     Family History  Problem Relation Age of Onset  . Diabetes Mellitus II Father     Past Surgical History:  Procedure Laterality Date  . INCISION AND DRAINAGE ABSCESS Left 01/01/2017   Procedure: INCISION AND DRAINAGE Left thigh ABSCESS;  Surgeon: Berna Bue, MD;  Location: [facility] OR;  Service: General;  Laterality: Left;  . INGUINAL HERNIA REPAIR Right    Social History   Occupational History  . Not on file.   Social History Main Topics  . Smoking status: Never Smoker  . Smokeless tobacco: Never Used  . Alcohol use No  . Drug use: No  . Sexual activity: Yes

## 2017-03-08 ENCOUNTER — Telehealth (INDEPENDENT_AMBULATORY_CARE_PROVIDER_SITE_OTHER): Payer: Self-pay | Admitting: Orthopaedic Surgery

## 2017-03-08 NOTE — Telephone Encounter (Signed)
I RECEIVED VM FROM Deputy DILLARD @ [facility], CHECKING STATUS OF REQUEST FOR RECORDS.  I CALLED BACK AND LMVM ADVISING THAT I FAXED RECORDS ON 3/19.AND I WOULD AND DID RE [facility] TO HIS ATTENTION [phone]

## 2017-03-26 ENCOUNTER — Encounter (INDEPENDENT_AMBULATORY_CARE_PROVIDER_SITE_OTHER): Payer: Self-pay | Admitting: Orthopaedic Surgery

## 2017-03-26 ENCOUNTER — Ambulatory Visit (INDEPENDENT_AMBULATORY_CARE_PROVIDER_SITE_OTHER): Payer: Worker's Compensation | Admitting: Orthopaedic Surgery

## 2017-03-26 DIAGNOSIS — M25562 Pain in left knee: Secondary | ICD-10-CM

## 2017-03-26 NOTE — Progress Notes (Signed)
   Office Visit Note   Patient: David Palmer           Date of Birth: 06/23/1971           MRN: [id number] Visit Date: 03/26/2017              [facility]ed [facility] Health, [facility] [address] [place], [place] [id number] PCP: Health, [facility]   Assessment & Plan: Visit Diagnoses:  1. Acute pain of left knee     Plan: At this point I think is appropriate to return to full duty. TED hose was provided for the swelling. Reassurances given through the use of an interpreter. Follow-up with me as needed. 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 Total face to face encounter time was greater than 25 minutes and over half of this time was spent in counseling and/or coordination of care.  Follow-Up Instructions: Return if symptoms worsen or fail to improve.   Orders:  No orders of the defined types were placed in this encounter.  No orders of the defined types were placed in this encounter.     Procedures: No procedures performed   Clinical Data: No additional findings.   Subjective: Chief Complaint  Patient presents with  . Left Knee - Pain, Follow-up    Back today for his left knee pain. He is complaining of some swelling of his left ankle. Overall he is not taking medicines. Please contact regular duty.    Review of Systems   Objective: Vital Signs: There were no vitals taken for this visit.  Physical Exam  Ortho Exam Left knee and ankle exam were benign except for some mild swelling. Specialty Comments:  No specialty comments available.  Imaging: No results found.   PMFS History: Patient Active Problem List   Diagnosis Date Noted  . Cellulitis 12/31/2016  . Cellulitis of left thigh 12/31/2016  . Hypothyroidism 12/31/2016  . Acute pain of left knee 12/27/2016   Past Medical History:  Diagnosis Date  . Hypothyroidism   . Thyroid disease     Family History  Problem Relation Age of Onset  .  Diabetes Mellitus II Father     Past Surgical History:  Procedure Laterality Date  . INCISION AND DRAINAGE ABSCESS Left 01/01/2017   Procedure: INCISION AND DRAINAGE Left thigh ABSCESS;  Surgeon: Berna Buehelsea A Connor, MD;  Location: [facility] OR;  Service: General;  Laterality: Left;  . INGUINAL HERNIA REPAIR Right    Social History   Occupational History  . Not on file.   Social History Main Topics  . Smoking status: Never Smoker  . Smokeless tobacco: Never Used  . Alcohol use No  . Drug use: No  . Sexual activity: Yes

## 2017-03-28 ENCOUNTER — Telehealth (INDEPENDENT_AMBULATORY_CARE_PROVIDER_SITE_OTHER): Payer: Self-pay

## 2017-03-28 NOTE — Telephone Encounter (Signed)
Faxed 03/26/17 office note to wc adj per her request

## 2017-04-09 ENCOUNTER — Ambulatory Visit (INDEPENDENT_AMBULATORY_CARE_PROVIDER_SITE_OTHER): Payer: Worker's Compensation | Admitting: Orthopaedic Surgery

## 2017-04-09 ENCOUNTER — Encounter (INDEPENDENT_AMBULATORY_CARE_PROVIDER_SITE_OTHER): Payer: Self-pay | Admitting: Orthopaedic Surgery

## 2017-04-09 DIAGNOSIS — M7989 Other specified soft tissue disorders: Secondary | ICD-10-CM

## 2017-04-09 NOTE — Progress Notes (Signed)
   Office Visit Note   Patient: David Palmer           Date of Birth: 05/16/1971           MRN: [id number] Visit Date: 04/09/2017              Requested by: Health, [facility] [address] [place], [place] [id number] PCP: Health, [facility]   Assessment & Plan: Visit Diagnoses:  1. Left leg swelling     Plan: Patient sounds like she has dependent edema from prolonged standing. We're awaiting approval of the TED hose by workers comp. I reassured him that this is normal to have. This was all done through an interpreter. This did increase the complexity of the visit. Total face to face encounter time was greater than 25 minutes and over half of this time was spent in counseling and/or coordination of care.  Follow-Up Instructions: Return if symptoms worsen or fail to improve.   Orders:  No orders of the defined types were placed in this encounter.  No orders of the defined types were placed in this encounter.     Procedures: No procedures performed   Clinical Data: No additional findings.   Subjective: Chief Complaint  Patient presents with  . Left Knee - Pain    Patient follows up today for new problem of left calf and ankle swelling. He states this is worse at the end of the day. He is been working full duty. He is been able to do that without any problems. He mainly complains of issues after work. He's been wearing over-the-counter support socks.    Review of Systems   Objective: Vital Signs: There were no vitals taken for this visit.  Physical Exam  Ortho Exam Exam of the left lower extremity shows no reproducible pain with palpation. He does not have any significant edema. Foot is warm and well perfused with strong pulses. Specialty Comments:  No specialty comments available.  Imaging: No results found.   PMFS History: Patient Active Problem List   Diagnosis Date Noted  . Left leg swelling 04/09/2017  . Cellulitis  12/31/2016  . Cellulitis of left thigh 12/31/2016  . Hypothyroidism 12/31/2016  . Acute pain of left knee 12/27/2016   Past Medical History:  Diagnosis Date  . Hypothyroidism   . Thyroid disease     Family History  Problem Relation Age of Onset  . Diabetes Mellitus II Father     Past Surgical History:  Procedure Laterality Date  . INCISION AND DRAINAGE ABSCESS Left 01/01/2017   Procedure: INCISION AND DRAINAGE Left thigh ABSCESS;  Surgeon: Berna Buehelsea A Connor, MD;  Location: [facility] OR;  Service: General;  Laterality: Left;  . INGUINAL HERNIA REPAIR Right    Social History   Occupational History  . Not on file.   Social History Main Topics  . Smoking status: Never Smoker  . Smokeless tobacco: Never Used  . Alcohol use No  . Drug use: No  . Sexual activity: Yes

## 2017-04-11 ENCOUNTER — Telehealth (INDEPENDENT_AMBULATORY_CARE_PROVIDER_SITE_OTHER): Payer: Self-pay

## 2017-04-11 NOTE — Telephone Encounter (Signed)
Maudie Flakesina Green [facility] case manager would like last office note faxed to [phone].  thank you

## 2017-04-11 NOTE — Telephone Encounter (Signed)
Faxed to [phone].

## 2017-04-17 ENCOUNTER — Telehealth (INDEPENDENT_AMBULATORY_CARE_PROVIDER_SITE_OTHER): Payer: Self-pay | Admitting: Orthopaedic Surgery

## 2017-04-17 NOTE — Telephone Encounter (Signed)
David Palmer  [facility] case [facility] called asked if she can get a Rx faxed to her for Ted hose for the patient. The fax# is [phone]  The ph# is [phone]

## 2017-04-18 NOTE — Telephone Encounter (Signed)
faxed

## 2017-04-19 ENCOUNTER — Telehealth (INDEPENDENT_AMBULATORY_CARE_PROVIDER_SITE_OTHER): Payer: Self-pay | Admitting: Orthopaedic Surgery

## 2017-04-19 NOTE — Telephone Encounter (Signed)
Maudie Flakesina Green [facility] case [facility] called advised she did not receive fax. Asked to please fax it again. The fax# is [phone]   The phone # is [phone]

## 2017-04-19 NOTE — Telephone Encounter (Signed)
REFAXED

## 2017-04-20 NOTE — Addendum Note (Signed)
Addendum  created 04/20/17 0939 by Emorie Mcfate, MD   Sign clinical note    

## 2017-04-25 ENCOUNTER — Telehealth (INDEPENDENT_AMBULATORY_CARE_PROVIDER_SITE_OTHER): Payer: Self-pay | Admitting: Orthopaedic Surgery

## 2017-04-25 NOTE — Telephone Encounter (Signed)
Maudie Flakesina Green (nurse case manager) with [facility] called advised she did not receive the fax. The fax number to refax is [phone]

## 2017-04-26 NOTE — Telephone Encounter (Signed)
Faxed hopefully they get it this time

## 2017-05-01 ENCOUNTER — Telehealth (INDEPENDENT_AMBULATORY_CARE_PROVIDER_SITE_OTHER): Payer: Self-pay | Admitting: Orthopaedic Surgery

## 2017-05-01 NOTE — Telephone Encounter (Signed)
Please advise 

## 2017-05-01 NOTE — Telephone Encounter (Signed)
That's fine

## 2017-05-01 NOTE — Telephone Encounter (Signed)
30 I think

## 2017-05-01 NOTE — Telephone Encounter (Signed)
David Palmer with [employer] department called advised she received a request for compression stockings for the patient. She advised she have some questions and asked for a call back. The number to to contact David Palmer is [phone]

## 2017-05-01 NOTE — Telephone Encounter (Signed)
Any certain compression you like?

## 2017-06-17 ENCOUNTER — Encounter (INDEPENDENT_AMBULATORY_CARE_PROVIDER_SITE_OTHER): Payer: Self-pay | Admitting: Orthopaedic Surgery

## 2017-06-17 ENCOUNTER — Ambulatory Visit (INDEPENDENT_AMBULATORY_CARE_PROVIDER_SITE_OTHER): Payer: Worker's Compensation | Admitting: Orthopaedic Surgery

## 2017-06-17 ENCOUNTER — Telehealth (INDEPENDENT_AMBULATORY_CARE_PROVIDER_SITE_OTHER): Payer: Self-pay

## 2017-06-17 DIAGNOSIS — M25562 Pain in left knee: Secondary | ICD-10-CM

## 2017-06-17 NOTE — Telephone Encounter (Signed)
Patient would like to know if he can be referred to Dr Fredricka Bonineonnor to have an Eval and ask a few questions.

## 2017-06-17 NOTE — Telephone Encounter (Signed)
Yes, he doesn't need referral.  He can make appt on his own.  It's quicker if he just makes his own appt rather than wait for a referral to be approved

## 2017-06-17 NOTE — Progress Notes (Signed)
Office Visit Note   Patient: David PoundsHector Bruno Garcia           Date of Birth: 02/19/1971           MRN: [id number] Visit Date: 06/17/2017              Requested by: Health, [facility] [address] [place], [place] [id number] PCP: Health, [facility]   Assessment & Plan: Visit Diagnoses:  1. Acute pain of left knee     Plan: Patient continues to have trouble with kneeling and squatting. Recommend MRI to rule out structural abnormalities. Follow-up after the MRI. From the thigh hematoma standpoint I will defer this to general surgery as to whether he is at MMI or not. I should be able to determine his MMI status and partial impairment rating at the next visit. Total face to face encounter time was greater than 25 minutes and over half of this time was spent in counseling and/or coordination of care.  Follow-Up Instructions: Return if symptoms worsen or fail to improve.   Orders:  Orders Placed This Encounter  Procedures  . MR Knee Left w/o contrast   No orders of the defined types were placed in this encounter.     Procedures: No procedures performed   Clinical Data: No additional findings.   Subjective: Chief Complaint  Patient presents with  . Left Knee - Pain    Patient follows up today for his left knee and leg pain. He states he has trouble kneeling down and getting up. He is currently doing full duty. He is not taking medicines.    Review of Systems  Constitutional: Negative.   All other systems reviewed and are negative.    Objective: Vital Signs: There were no vitals taken for this visit.  Physical Exam  Constitutional: He is oriented to person, place, and time. He appears well-developed and well-nourished.  HENT:  Head: Normocephalic and atraumatic.  Eyes: Pupils are equal, round, and reactive to light.  Neck: Neck supple.  Pulmonary/Chest: Effort normal.  Abdominal: Soft.  Musculoskeletal: Normal range of motion.    Neurological: He is alert and oriented to person, place, and time.  Skin: Skin is warm.  Psychiatric: He has a normal mood and affect. His behavior is normal. Judgment and thought content normal.  Nursing note and vitals reviewed.   Ortho Exam Left knee exam shows no joint effusion.  No joint line tenderness. Collaterals and cruciates are stable. Overlying skin is normal. Specialty Comments:  No specialty comments available.  Imaging: No results found.   PMFS History: Patient Active Problem List   Diagnosis Date Noted  . Left leg swelling 04/09/2017  . Cellulitis 12/31/2016  . Cellulitis of left thigh 12/31/2016  . Hypothyroidism 12/31/2016  . Acute pain of left knee 12/27/2016   Past Medical History:  Diagnosis Date  . Hypothyroidism   . Thyroid disease     Family History  Problem Relation Age of Onset  . Diabetes Mellitus II Father     Past Surgical History:  Procedure Laterality Date  . INCISION AND DRAINAGE ABSCESS Left 01/01/2017   Procedure: INCISION AND DRAINAGE Left thigh ABSCESS;  Surgeon: Berna Buehelsea A Connor, MD;  Location: [facility] OR;  Service: General;  Laterality: Left;  . INGUINAL HERNIA REPAIR Right    Social History   Occupational History  . Not on file.   Social History Main Topics  . Smoking status: Never Smoker  . Smokeless tobacco: Never Used  .  Alcohol use No  . Drug use: No  . Sexual activity: Yes

## 2017-06-18 ENCOUNTER — Telehealth (INDEPENDENT_AMBULATORY_CARE_PROVIDER_SITE_OTHER): Payer: Self-pay

## 2017-06-18 NOTE — Telephone Encounter (Signed)
Called to advise.  

## 2017-06-18 NOTE — Telephone Encounter (Signed)
Faxed the 06/17/17 office note and MRI order to case mgr per her request

## 2017-07-04 ENCOUNTER — Ambulatory Visit
Admission: RE | Admit: 2017-07-04 | Discharge: 2017-07-04 | Disposition: A | Discharge status: Home / Self Care | Payer: Worker's Compensation | Source: Ambulatory Visit | Attending: Orthopaedic Surgery | Admitting: Orthopaedic Surgery

## 2017-07-04 DIAGNOSIS — M25562 Pain in left knee: Secondary | ICD-10-CM

## 2017-07-12 ENCOUNTER — Ambulatory Visit (INDEPENDENT_AMBULATORY_CARE_PROVIDER_SITE_OTHER): Payer: Worker's Compensation | Admitting: Orthopaedic Surgery

## 2017-07-12 DIAGNOSIS — G8929 Other chronic pain: Secondary | ICD-10-CM | POA: Diagnosis not present

## 2017-07-12 DIAGNOSIS — M25562 Pain in left knee: Secondary | ICD-10-CM

## 2017-07-12 NOTE — Progress Notes (Signed)
   Office Visit Note   Patient: David Palmer           Date of Birth: Mar 24, 1971           MRN: [id number] Visit Date: 07/12/2017              Requested by: Health, [facility] [address] [place], [place] [address] PCP: Health, [facility]   Assessment & Plan: Visit Diagnoses:  1. Chronic pain of left knee     Plan: MRI does not show any structural damage or acute abnormalities related to the injury. Just some mild tendinosis of his extensor mechanism. These findings were discussed with the patient through the interpreter. Reassurances given. Follow-up as needed. Total face to face encounter time was greater than 25 minutes and over half of this time was spent in counseling and/or coordination of care.  Follow-Up Instructions: Return if symptoms worsen or fail to improve.   Orders:  No orders of the defined types were placed in this encounter.  No orders of the defined types were placed in this encounter.     Procedures: No procedures performed   Clinical Data: No additional findings.   Subjective: No chief complaint on file.   Patient follows up today for MRI review. He states he still has a little discomfort on the medial aspect of the knee.    Review of Systems   Objective: Vital Signs: There were no vitals taken for this visit.  Physical Exam  Ortho Exam Left knee exam is benign. Specialty Comments:  No specialty comments available.  Imaging: No results found.   PMFS History: Patient Active Problem List   Diagnosis Date Noted  . Left leg swelling 04/09/2017  . Cellulitis 12/31/2016  . Cellulitis of left thigh 12/31/2016  . Hypothyroidism 12/31/2016  . Acute pain of left knee 12/27/2016   Past Medical History:  Diagnosis Date  . Hypothyroidism   . Thyroid disease     Family History  Problem Relation Age of Onset  . Diabetes Mellitus II Father     Past Surgical History:  Procedure Laterality Date  .  INCISION AND DRAINAGE ABSCESS Left 01/01/2017   Procedure: INCISION AND DRAINAGE Left thigh ABSCESS;  Surgeon: Berna Bue, MD;  Location: [facility] OR;  Service: General;  Laterality: Left;  . INGUINAL HERNIA REPAIR Right    Social History   Occupational History  . Not on file.   Social History Main Topics  . Smoking status: Never Smoker  . Smokeless tobacco: Never Used  . Alcohol use No  . Drug use: No  . Sexual activity: Yes

## 2017-07-15 ENCOUNTER — Telehealth (INDEPENDENT_AMBULATORY_CARE_PROVIDER_SITE_OTHER): Payer: Self-pay

## 2017-07-15 NOTE — Telephone Encounter (Signed)
Faxed the 07/12/17 office note and MRI report to case mgr per her request

## 2017-08-12 ENCOUNTER — Telehealth (INDEPENDENT_AMBULATORY_CARE_PROVIDER_SITE_OTHER): Payer: Self-pay | Admitting: Orthopaedic Surgery

## 2017-08-12 NOTE — Telephone Encounter (Signed)
David Palmer, Case Manager, for [employer] called stating that she received the office notes from the August 24th visit but did not receive a work status note or letter.  CB#[phone] ext [phone]

## 2017-08-12 NOTE — Telephone Encounter (Signed)
What is patient's work status?  

## 2017-08-12 NOTE — Telephone Encounter (Signed)
Back to work.  No restrictions

## 2017-08-13 NOTE — Telephone Encounter (Signed)
NOTE MADE 

## 2017-08-13 NOTE — Telephone Encounter (Signed)
FAXED WORK NOTE TO [phone]

## 2019-04-07 IMAGING — MR MR KNEE*L* W/O CM
5 of 6 series · 34 of 40 positions shown · non-contrast
Comparison: Radiographs 12/17/2016

CLINICAL DATA: Left pain and weakness since an injury 7 months ago.

EXAM:
MRI OF THE LEFT KNEE WITHOUT CONTRAST
TECHNIQUE: Multiplanar, multisequence MR imaging of the knee was performed. No
intravenous contrast was administered.

[Series 2: PD · axial · 4.0mm · 0.36mm/px · z∈[-104,+10]mm · 7 of 24 slices shown (1 of 2)]
[im 1/24]
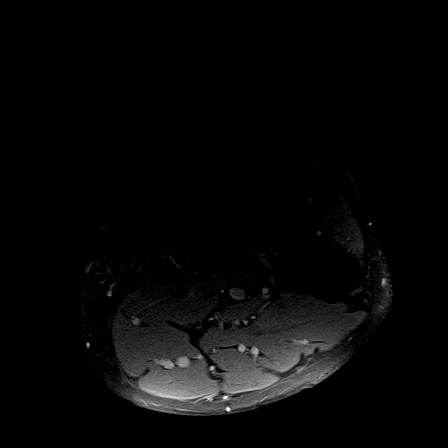
[im 4/24]
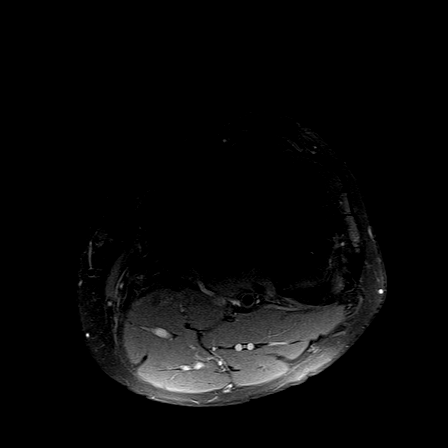
[im 8/24]
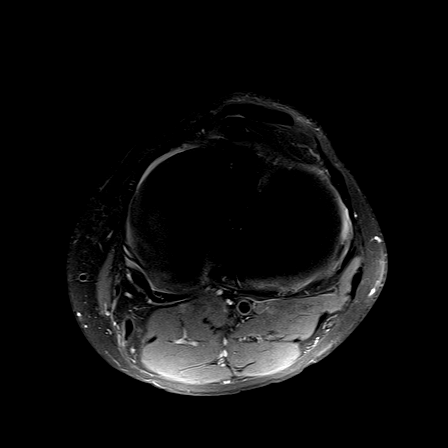
[im 12/24]
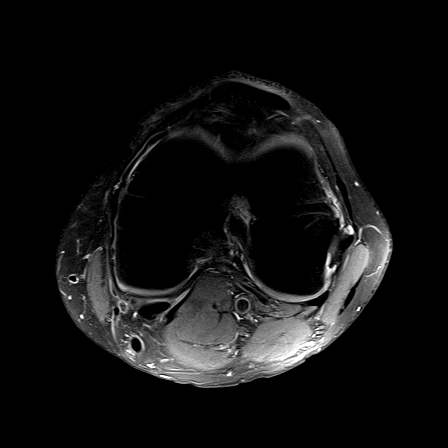
[im 16/24]
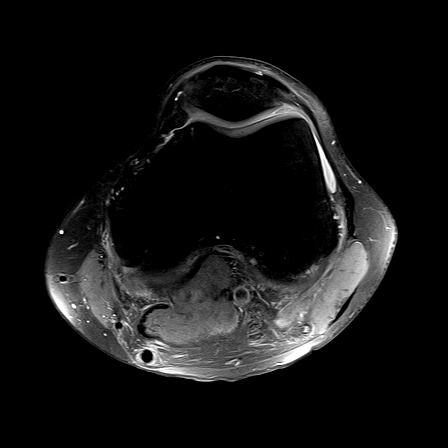
[im 20/24]
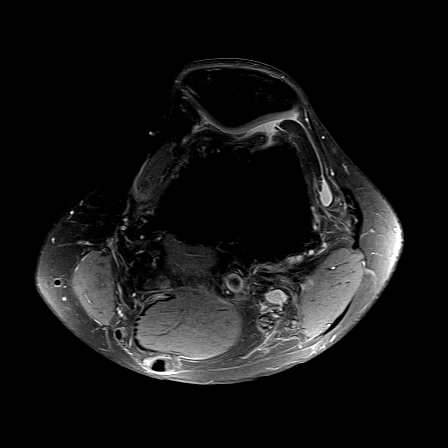
[im 24/24]
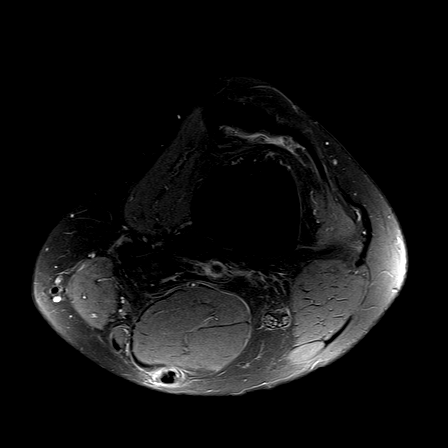

[Series 3: T1 · coronal · 4.0mm · 0.44mm/px · 7 of 20 slices shown]
[im 1/20]
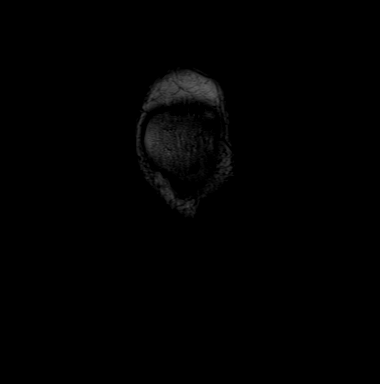
[im 4/20]
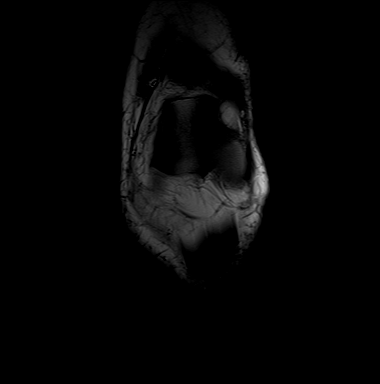
[im 7/20]
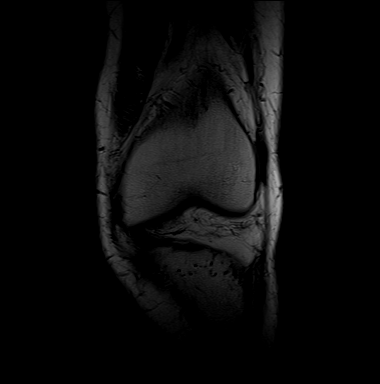
[im 10/20]
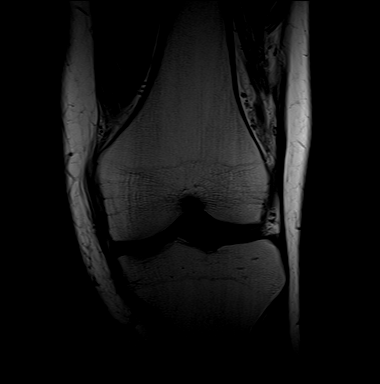
[im 13/20]
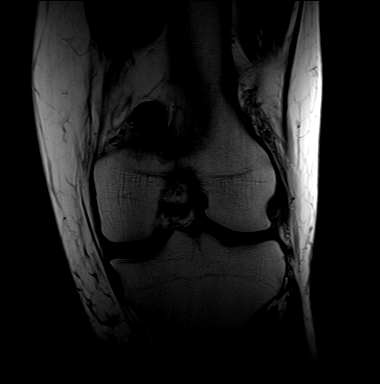
[im 16/20]
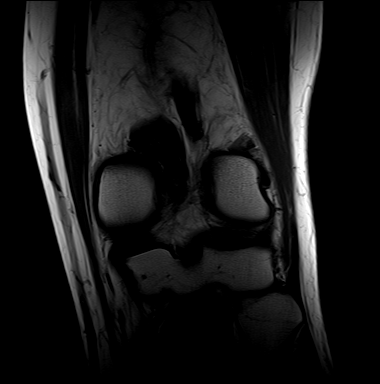
[im 20/20]
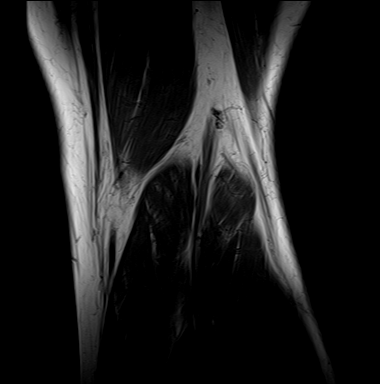

[Series 4: (id) fs · coronal · 4.0mm · 0.53mm/px · 5 of 20 slices shown]
[im 1/20]
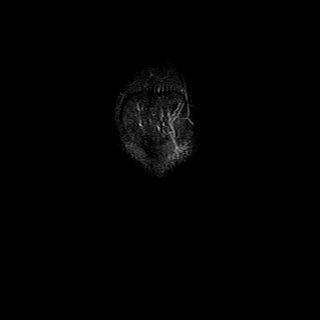
[im 4/20]
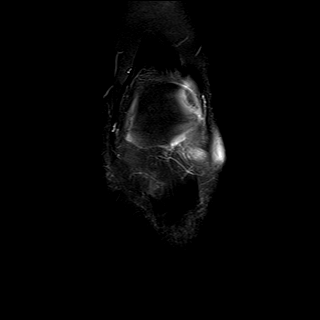
[im 7/20]
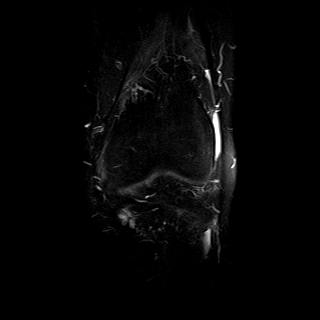
[im 10/20]
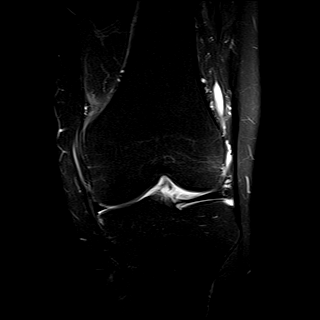
[im 13/20]
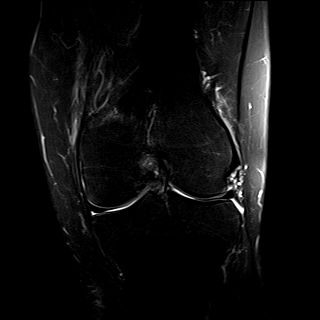

[Series 5: PD fat-sat · sagittal · 4.0mm · 0.42mm/px · 8 of 23 slices shown]
[im 1/23]
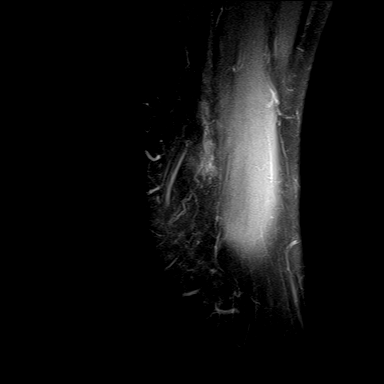
[im 4/23]
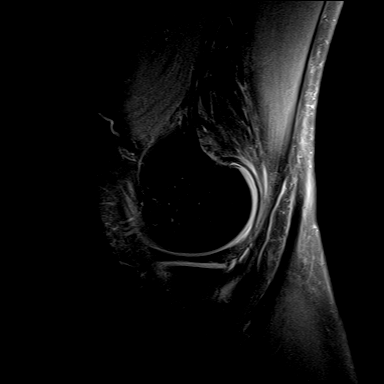
[im 7/23]
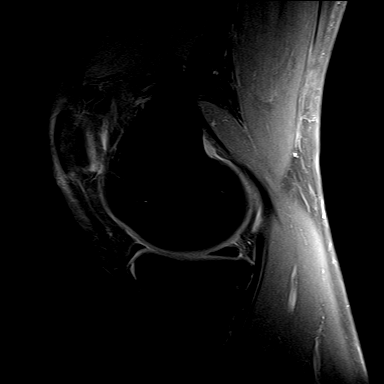
[im 10/23]
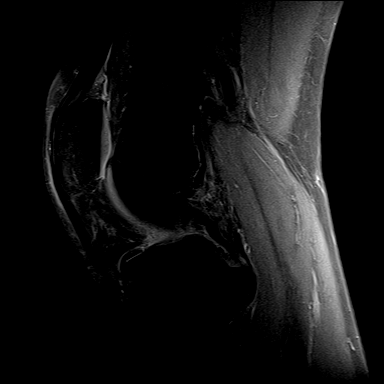
[im 13/23]
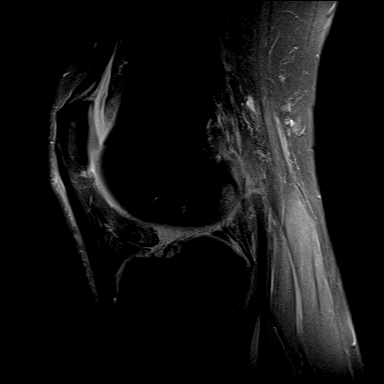
[im 16/23]
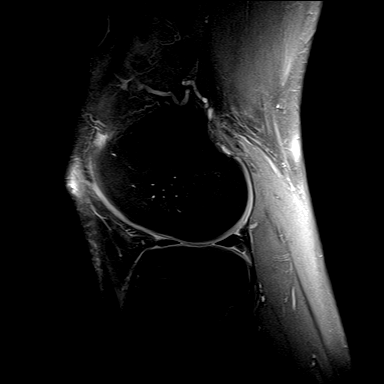
[im 19/23]
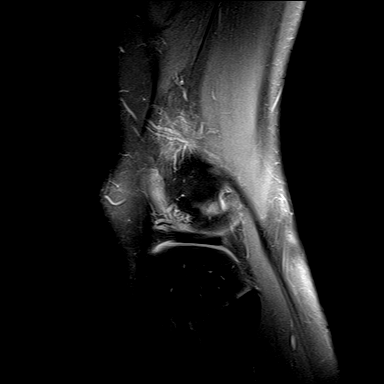
[im 23/23]
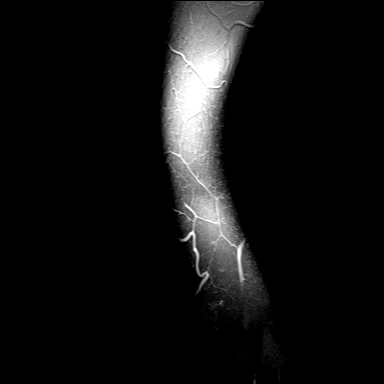

[Series 6: PD · coronal · 4.0mm · 0.44mm/px · 7 of 20 slices shown (2 of 2)]
[im 1/20]
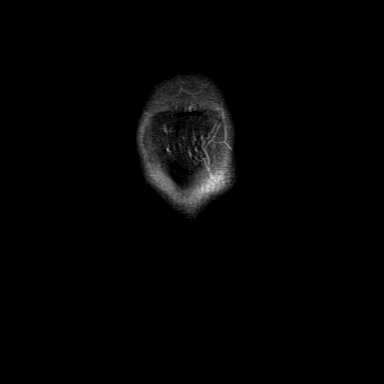
[im 4/20]
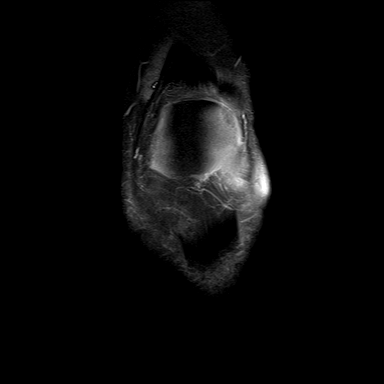
[im 7/20]
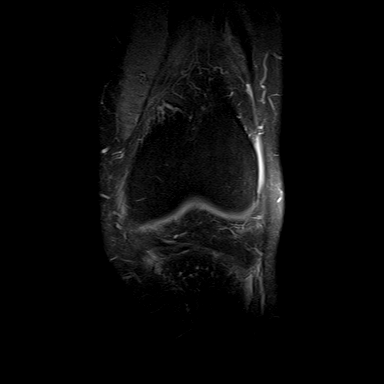
[im 10/20]
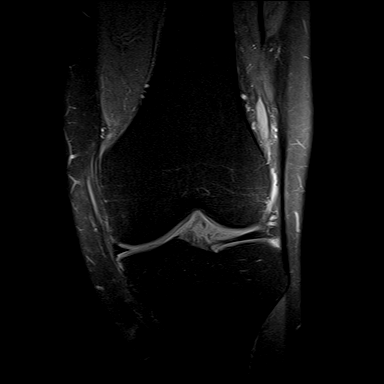
[im 13/20]
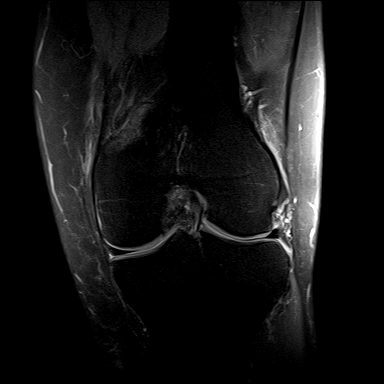
[im 16/20]
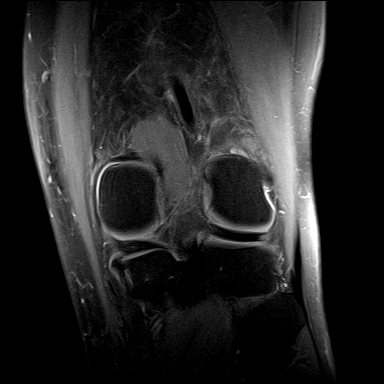
[im 20/20]
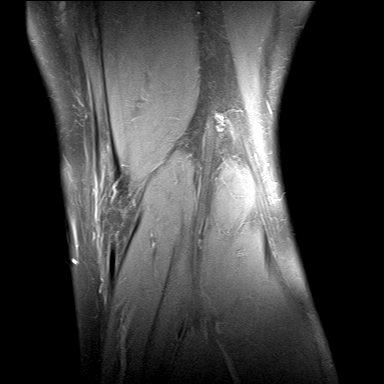

[34 of 40 positions shown; findings below may reference images not displayed]

FINDINGS: MENISCI

Medial meniscus:  Intact

Lateral meniscus:  Intact

LIGAMENTS

Cruciates:  Intact

Collaterals:  Intact

CARTILAGE

Patellofemoral:  Normal

Medial:  Normal

Lateral:  Normal

Joint:  No joint effusion

Popliteal Fossa:  No popliteal mass or Baker's cyst.

Extensor Mechanism: The patella retinacular structures are intact
and the quadriceps and patellar tendons are intact. Mild distal
quadriceps and proximal patellar tendinopathy.

Bones: No acute bony findings. No bone contusion or marrow edema. No
osteochondral lesions.

Other: Normal knee musculature.
IMPRESSION: 1. Intact ligamentous structures and no acute bony findings.
2. No meniscal tear is and normal articular cartilage.
3. Mild distal quadriceps and proximal patellar tendinopathy.
4. No joint effusion or Baker's cyst.
